# Patient Record
Sex: Female | Born: 1990 | Race: Black or African American | Hispanic: No | Marital: Single | Smoking: Never smoker
Health system: Southern US, Community
[De-identification: ages and names within clinical notes are randomized; demographics above are authoritative.]

## PROBLEM LIST (undated history)

## (undated) DIAGNOSIS — N189 Chronic kidney disease, unspecified: Secondary | ICD-10-CM

## (undated) DIAGNOSIS — M329 Systemic lupus erythematosus, unspecified: Secondary | ICD-10-CM

## (undated) DIAGNOSIS — I10 Essential (primary) hypertension: Secondary | ICD-10-CM

## (undated) DIAGNOSIS — N052 Unspecified nephritic syndrome with diffuse membranous glomerulonephritis: Secondary | ICD-10-CM

## (undated) DIAGNOSIS — J849 Interstitial pulmonary disease, unspecified: Secondary | ICD-10-CM

## (undated) DIAGNOSIS — J45909 Unspecified asthma, uncomplicated: Secondary | ICD-10-CM

## (undated) HISTORY — PX: CHOLECYSTECTOMY: SHX55

## (undated) HISTORY — DX: Chronic kidney disease, unspecified: N18.9

## (undated) HISTORY — DX: Systemic lupus erythematosus, unspecified: M32.9

## (undated) HISTORY — DX: Interstitial pulmonary disease, unspecified: J84.9

## (undated) HISTORY — PX: RENAL BIOPSY: SHX156

## (undated) HISTORY — DX: Unspecified nephritic syndrome with diffuse membranous glomerulonephritis: N05.2

## (undated) HISTORY — DX: Essential (primary) hypertension: I10

## (undated) HISTORY — PX: LUNG BIOPSY: SHX232

## (undated) NOTE — *Deleted (*Deleted)
Preventive Care 21-39 Years Old, Female Preventive care refers to visits with your health care provider and lifestyle choices that can promote health and wellness. This includes:  A yearly physical exam. This may also be called an annual well check.  Regular dental visits and eye exams.  Immunizations.  Screening for certain conditions.  Healthy lifestyle choices, such as eating a healthy diet, getting regular exercise, not using drugs or products that contain nicotine and tobacco, and limiting alcohol use. What can I expect for my preventive care visit? Physical exam Your health care provider will check your:  Height and weight. This may be used to calculate body mass index (BMI), which tells if you are at a healthy weight.  Heart rate and blood pressure.  Skin for abnormal spots. Counseling Your health care provider may ask you questions about your:  Alcohol, tobacco, and drug use.  Emotional well-being.  Home and relationship well-being.  Sexual activity.  Eating habits.  Work and work environment.  Method of birth control.  Menstrual cycle.  Pregnancy history. What immunizations do I need?  Influenza (flu) vaccine  This is recommended every year. Tetanus, diphtheria, and pertussis (Tdap) vaccine  You may need a Td booster every 10 years. Varicella (chickenpox) vaccine  You may need this if you have not been vaccinated. Human papillomavirus (HPV) vaccine  If recommended by your health care provider, you may need three doses over 6 months. Measles, mumps, and rubella (MMR) vaccine  You may need at least one dose of MMR. You may also need a second dose. Meningococcal conjugate (MenACWY) vaccine  One dose is recommended if you are age 19-21 years and a first-year college student living in a residence hall, or if you have one of several medical conditions. You may also need additional booster doses. Pneumococcal conjugate (PCV13) vaccine  You may need  this if you have certain conditions and were not previously vaccinated. Pneumococcal polysaccharide (PPSV23) vaccine  You may need one or two doses if you smoke cigarettes or if you have certain conditions. Hepatitis A vaccine  You may need this if you have certain conditions or if you travel or work in places where you may be exposed to hepatitis A. Hepatitis B vaccine  You may need this if you have certain conditions or if you travel or work in places where you may be exposed to hepatitis B. Haemophilus influenzae type b (Hib) vaccine  You may need this if you have certain conditions. You may receive vaccines as individual doses or as more than one vaccine together in one shot (combination vaccines). Talk with your health care provider about the risks and benefits of combination vaccines. What tests do I need?  Blood tests  Lipid and cholesterol levels. These may be checked every 5 years starting at age 20.  Hepatitis C test.  Hepatitis B test. Screening  Diabetes screening. This is done by checking your blood sugar (glucose) after you have not eaten for a while (fasting).  Sexually transmitted disease (STD) testing.  BRCA-related cancer screening. This may be done if you have a family history of breast, ovarian, tubal, or peritoneal cancers.  Pelvic exam and Pap test. This may be done every 3 years starting at age 21. Starting at age 30, this may be done every 5 years if you have a Pap test in combination with an HPV test. Talk with your health care provider about your test results, treatment options, and if necessary, the need for more tests.   Follow these instructions at home: Eating and drinking   Eat a diet that includes fresh fruits and vegetables, whole grains, lean protein, and low-fat dairy.  Take vitamin and mineral supplements as recommended by your health care provider.  Do not drink alcohol if: ? Your health care provider tells you not to drink. ? You are  pregnant, may be pregnant, or are planning to become pregnant.  If you drink alcohol: ? Limit how much you have to 0-1 drink a day. ? Be aware of how much alcohol is in your drink. In the [place] one drink equals one 12 oz bottle of beer (355 mL), one 5 oz glass of wine (148 mL), or one 1 oz glass of hard liquor (44 mL). Lifestyle  Take daily care of your teeth and gums.  Stay active. Exercise for at least 30 minutes on 5 or more days each week.  Do not use any products that contain nicotine or tobacco, such as cigarettes, e-cigarettes, and chewing tobacco. If you need help quitting, ask your health care provider.  If you are sexually active, practice safe sex. Use a condom or other form of birth control (contraception) in order to prevent pregnancy and STIs (sexually transmitted infections). If you plan to become pregnant, see your health care provider for a preconception visit. What's next?  Visit your health care provider once a year for a well check visit.  Ask your health care provider how often you should have your eyes and teeth checked.  Stay up to date on all vaccines. This information is not intended to replace advice given to you by your health care provider. Make sure you discuss any questions you have with your health care provider. Document Revised: 10/09/2017 Document Reviewed: 10/09/2017 Elsevier Patient Education  2020 [employer] Breast Self-Awareness Breast self-awareness is knowing how your breasts look and feel. Doing breast self-awareness is important. It allows you to catch a breast problem early while it is still small and can be treated. All women should do breast self-awareness, including women who have had breast implants. Tell your doctor if you notice a change in your breasts. What you need:  A mirror.  A well-lit room. How to do a breast self-exam A breast self-exam is one way to learn what is normal for your breasts and to check for changes. To do a  breast self-exam: Look for changes  1. Take off all the clothes above your waist. 2. Stand in front of a mirror in a room with good lighting. 3. Put your hands on your hips. 4. Push your hands down. 5. Look at your breasts and nipples in the mirror to see if one breast or nipple looks different from the other. Check to see if: ? The shape of one breast is different. ? The size of one breast is different. ? There are wrinkles, dips, and bumps in one breast and not the other. 6. Look at each breast for changes in the skin, such as: ? Redness. ? Scaly areas. 7. Look for changes in your nipples, such as: ? Liquid around the nipples. ? Bleeding. ? Dimpling. ? Redness. ? A change in where the nipples are. Feel for changes  1. Lie on your back on the floor. 2. Feel each breast. To do this, follow these steps: ? Pick a breast to feel. ? Put the arm closest to that breast above your head. ? Use your other arm to feel the nipple area of your breast. Feel   the area with the pads of your three middle fingers by making small circles with your fingers. For the first circle, press lightly. For the second circle, press harder. For the third circle, press even harder. ? Keep making circles with your fingers at the different pressures as you move down your breast. Stop when you feel your ribs. ? Move your fingers a little toward the center of your body. ? Start making circles with your fingers again, this time going up until you reach your collarbone. ? Keep making up-and-down circles until you reach your armpit. Remember to keep using the three pressures. ? Feel the other breast in the same way. 3. Sit or stand in the tub or shower. 4. With soapy water on your skin, feel each breast the same way you did in step 2 when you were lying on the floor. Write down what you find Writing down what you find can help you remember what to tell your doctor. Write down:  What is normal for each breast.  Any  changes you find in each breast, including: ? The kind of changes you find. ? Whether you have pain. ? Size and location of any lumps.  When you last had your menstrual period. General tips  Check your breasts every month.  If you are breastfeeding, the best time to check your breasts is after you feed your baby or after you use a breast pump.  If you get menstrual periods, the best time to check your breasts is 5-7 days after your menstrual period is over.  With time, you will become comfortable with the self-exam, and you will begin to know if there are changes in your breasts. Contact a doctor if you:  See a change in the shape or size of your breasts or nipples.  See a change in the skin of your breast or nipples, such as red or scaly skin.  Have fluid coming from your nipples that is not normal.  Find a lump or thick area that was not there before.  Have pain in your breasts.  Have any concerns about your breast health. Summary  Breast self-awareness includes looking for changes in your breasts, as well as feeling for changes within your breasts.  Breast self-awareness should be done in front of a mirror in a well-lit room.  You should check your breasts every month. If you get menstrual periods, the best time to check your breasts is 5-7 days after your menstrual period is over.  Let your doctor know of any changes you see in your breasts, including changes in size, changes on the skin, pain or tenderness, or fluid from your nipples that is not normal. This information is not intended to replace advice given to you by your health care provider. Make sure you discuss any questions you have with your health care provider. Document Revised: 09/16/2017 Document Reviewed: 09/16/2017 Elsevier Patient Education  2020 Elsevier Inc.  

---
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
## 2011-10-21 DIAGNOSIS — E785 Hyperlipidemia, unspecified: Secondary | ICD-10-CM | POA: Insufficient documentation

## 2012-05-08 DIAGNOSIS — M62830 Muscle spasm of back: Secondary | ICD-10-CM | POA: Insufficient documentation

## 2012-05-08 DIAGNOSIS — M7071 Other bursitis of hip, right hip: Secondary | ICD-10-CM | POA: Insufficient documentation

## 2015-07-14 DIAGNOSIS — H9201 Otalgia, right ear: Secondary | ICD-10-CM | POA: Insufficient documentation

## 2016-03-30 DIAGNOSIS — R0602 Shortness of breath: Secondary | ICD-10-CM | POA: Diagnosis not present

## 2016-03-30 DIAGNOSIS — R0609 Other forms of dyspnea: Secondary | ICD-10-CM | POA: Diagnosis not present

## 2016-03-30 DIAGNOSIS — R51 Headache: Secondary | ICD-10-CM | POA: Diagnosis not present

## 2016-03-30 DIAGNOSIS — L930 Discoid lupus erythematosus: Secondary | ICD-10-CM | POA: Diagnosis not present

## 2016-07-11 DIAGNOSIS — G43909 Migraine, unspecified, not intractable, without status migrainosus: Secondary | ICD-10-CM | POA: Diagnosis not present

## 2016-10-28 DIAGNOSIS — R001 Bradycardia, unspecified: Secondary | ICD-10-CM | POA: Diagnosis not present

## 2016-10-28 DIAGNOSIS — R079 Chest pain, unspecified: Secondary | ICD-10-CM | POA: Diagnosis not present

## 2016-12-03 DIAGNOSIS — R238 Other skin changes: Secondary | ICD-10-CM | POA: Diagnosis not present

## 2016-12-03 DIAGNOSIS — M3213 Lung involvement in systemic lupus erythematosus: Secondary | ICD-10-CM | POA: Diagnosis not present

## 2016-12-03 DIAGNOSIS — I279 Pulmonary heart disease, unspecified: Secondary | ICD-10-CM | POA: Diagnosis not present

## 2017-02-14 DIAGNOSIS — R109 Unspecified abdominal pain: Secondary | ICD-10-CM | POA: Diagnosis not present

## 2017-02-14 DIAGNOSIS — N3001 Acute cystitis with hematuria: Secondary | ICD-10-CM | POA: Diagnosis not present

## 2017-02-17 ENCOUNTER — Emergency Department: Payer: Medicaid Other

## 2017-02-17 ENCOUNTER — Other Ambulatory Visit: Payer: Self-pay

## 2017-02-17 ENCOUNTER — Emergency Department
Admission: EM | Admit: 2017-02-17 | Discharge: 2017-02-17 | Disposition: A | Discharge status: Home / Self Care | Payer: Medicaid Other | Attending: Emergency Medicine | Admitting: Emergency Medicine

## 2017-02-17 DIAGNOSIS — R079 Chest pain, unspecified: Secondary | ICD-10-CM | POA: Diagnosis present

## 2017-02-17 DIAGNOSIS — R0789 Other chest pain: Secondary | ICD-10-CM

## 2017-02-17 LAB — COMPREHENSIVE METABOLIC PANEL
ALBUMIN: 3.8 g/dL (ref 3.5–5.0)
ALK PHOS: 78 U/L (ref 38–126)
ALT: 9 U/L — ABNORMAL LOW (ref 14–54)
ANION GAP: 11 (ref 5–15)
AST: 15 U/L (ref 15–41)
BUN: 20 mg/dL (ref 6–20)
CO2: 19 mmol/L — AB (ref 22–32)
Calcium: 8.8 mg/dL — ABNORMAL LOW (ref 8.9–10.3)
Chloride: 106 mmol/L (ref 101–111)
Creatinine, Ser: 0.79 mg/dL (ref 0.44–1.00)
GFR calc Af Amer: >60 mL/min (ref >60)
GFR calc non Af Amer: >60 mL/min (ref >60)
GLUCOSE: 98 mg/dL (ref 65–99)
POTASSIUM: 3.2 mmol/L — AB (ref 3.5–5.1)
SODIUM: 136 mmol/L (ref 135–145)
Total Bilirubin: 0.8 mg/dL (ref 0.3–1.2)
Total Protein: 7.1 g/dL (ref 6.5–8.1)

## 2017-02-17 LAB — POCT PREGNANCY, URINE
Preg Test, Ur: NEGATIVE
Preg Test, Ur: NEGATIVE

## 2017-02-17 LAB — CBC
HCT: 45.6 % (ref 35.0–47.0)
Hemoglobin: 14.9 g/dL (ref 12.0–16.0)
MCH: 29.6 pg (ref 26.0–34.0)
MCHC: 32.6 g/dL (ref 32.0–36.0)
MCV: 90.9 fL (ref 80.0–100.0)
Platelets: 273 10*3/uL (ref 150–440)
RBC: 5.02 MIL/uL (ref 3.80–5.20)
RDW: 15.1 % — AB (ref 11.5–14.5)
WBC: 5 10*3/uL (ref 3.6–11.0)

## 2017-02-17 LAB — TROPONIN I: Troponin I: <0.03 ng/mL (ref <0.03)

## 2017-02-17 MED ORDER — GI COCKTAIL ~~LOC~~
30.0000 mL | Freq: Once | ORAL | Status: AC
  Administered 2017-02-17: 30 mL via ORAL

## 2017-02-17 MED ORDER — TRAMADOL HCL 50 MG PO TABS: 50.0000 mg | ORAL_TABLET | Freq: Four times a day (QID) | ORAL | 0 refills | Status: DC | PRN

## 2017-02-17 MED ORDER — GI COCKTAIL ~~LOC~~
ORAL | Status: AC
  Filled 2017-02-17: qty 30

## 2017-02-17 NOTE — ED Triage Notes (Signed)
Chest pain with inspiration began today. Denies sore throat or body aches. History of lupus.

## 2017-02-17 NOTE — ED Notes (Addendum)
Patient in Xray at this time.

## 2017-02-17 NOTE — ED Notes (Signed)
Centralized chest pain, sharp-constant. Pt also reports SOB while at rest as well; sx and began today. Pt alert and oriented X4, active, cooperative, pt in NAD. RR even and unlabored, color WNL.

## 2017-02-17 NOTE — ED Notes (Signed)
Pt back from x-ray.

## 2017-02-17 NOTE — ED Provider Notes (Signed)
[facility] Emergency Department Provider Note   ____________________________________________    I have reviewed the triage vital signs and the nursing notes.   HISTORY  Chief Complaint Chest Pain     HPI Faith Camacho is a 27 y.o. female who presents with complaints of intermittent sharp chest pain which is in the center of her chest which started early this morning.  She reports she had this recently in the fall and was seen at hospital in [place] and had CT scan which was negative for PE and essentially negative workup.  She does have a history of lupus and takes mycophenolate and prednisone for this.  Denies fevers or chills or cough.  No nausea or vomiting or diaphoresis.  Pain seems to be unrelated to exertion.  Has not taken anything for   Hx of lupus  There are no active problems to display for this patient.    Prior to Admission medications   Medication Sig Start Date End Date Taking? Authorizing Provider  traMADol (ULTRAM) 50 MG tablet Take 1 tablet (50 mg total) by mouth every 6 (six) hours as needed. 02/17/17 02/17/18  Jene Every, MD     Allergies Patient has no known allergies.  No family history on file.  Social History No smoking, no drugs, no etoh  Review of Systems  Constitutional: No fever/chills Eyes: No visual changes.  ENT: No sore throat. Cardiovascular: As above Respiratory: Mild pleurisy Gastrointestinal: No abdominal pain.  No nausea, no vomiting.   Genitourinary: Negative for dysuria. Musculoskeletal: Negative for back pain. Skin: Negative for rash. Neurological: Negative for headaches    ____________________________________________   PHYSICAL EXAM:  VITAL SIGNS: ED Triage Vitals  Enc Vitals Group     BP 02/17/17 1647 (!) 129/97     Pulse Rate 02/17/17 1647 81     Resp 02/17/17 1647 20     Temp 02/17/17 1647 99 F (37.2 C)     Temp Source 02/17/17 1647 Oral     SpO2 02/17/17 1647 100 %   Weight 02/17/17 1648 81.6 kg (180 lb)     Height 02/17/17 1648 1.499 m (4\' 11" )     Head Circumference --      Peak Flow --      Pain Score 02/17/17 1647 8     Pain Loc --      Pain Edu? --      Excl. in GC? --     Constitutional: Alert and oriented. No acute distress. Pleasant and interactive Eyes: Conjunctivae are normal.   Nose: No congestion/rhinnorhea. Mouth/Throat: Mucous membranes are moist.   Neck:  Painless ROM Cardiovascular: Normal rate, regular rhythm. Grossly normal heart sounds.  Good peripheral circulation.  Chest wall tenderness to palpation Respiratory: Normal respiratory effort.  No retractions. Lungs CTAB. Gastrointestinal: Soft and nontender. No distention.   Genitourinary: deferred Musculoskeletal: No lower extremity tenderness nor edema.  Warm and well perfused Neurologic:  Normal speech and language. No gross focal neurologic deficits are appreciated.  Skin:  Skin is warm, dry and intact. No rash noted. Psychiatric: Mood and affect are normal. Speech and behavior are normal.  ____________________________________________   LABS (all labs ordered are listed, but only abnormal results are displayed)  Labs Reviewed  CBC - Abnormal; Notable for the following components:      Result Value   RDW 15.1 (*)    All other components within normal limits  COMPREHENSIVE METABOLIC PANEL - Abnormal; Notable for the following components:  Potassium 3.2 (*)    CO2 19 (*)    Calcium 8.8 (*)    ALT 9 (*)    All other components within normal limits  TROPONIN I  POC URINE PREG, ED  POCT PREGNANCY, URINE  POCT PREGNANCY, URINE   ____________________________________________  EKG  ED ECG REPORT I, Jene EveryKINNER, Juri Dinning, the attending physician, personally viewed and interpreted this ECG.  Date: 02/17/2017  Rhythm: normal sinus rhythm QRS Axis: normal Intervals: normal ST/T Wave abnormalities: normal Narrative Interpretation: no evidence of acute  ischemia  ____________________________________________  RADIOLOGY  Chest x-ray normal ____________________________________________   PROCEDURES  Procedure(s) performed: No  Procedures   Critical Care performed: No ____________________________________________   INITIAL IMPRESSION / ASSESSMENT AND PLAN / ED COURSE  Pertinent labs & imaging results that were available during my care of the patient were reviewed by me and considered in my medical decision making (see chart for details).  Patient well-appearing in no acute distress.  Vital signs are unremarkable.  Exam is reassuring.  Given that she recently had the same pain and underwent normal CT angiography I am reassured that this is unlikely to be a PE.  EKG is completely normal.  Will check labs, and reevaluate.  Workup reassuring, patient did have  improvement with GI cocktail, possibly component of gastritis to this versus esophagitis.  Regardless I will have her follow-up with cardiology for thorough evaluation given her history of lupus.  Return precautions discussed with the patient, she agrees with plan    ____________________________________________   FINAL CLINICAL IMPRESSION(S) / ED DIAGNOSES  Final diagnoses:  Atypical chest pain        Note:  This document was prepared using Dragon voice recognition software and may include unintentional dictation errors.    Jene EveryKinner, Devonte Migues, MD 02/17/17 [phone]

## 2017-02-21 DIAGNOSIS — E782 Mixed hyperlipidemia: Secondary | ICD-10-CM | POA: Diagnosis not present

## 2017-02-21 DIAGNOSIS — I208 Other forms of angina pectoris: Secondary | ICD-10-CM

## 2017-02-21 DIAGNOSIS — I10 Essential (primary) hypertension: Secondary | ICD-10-CM | POA: Diagnosis not present

## 2017-03-10 ENCOUNTER — Emergency Department
Admission: EM | Admit: 2017-03-10 | Discharge: 2017-03-10 | Disposition: A | Discharge status: Home / Self Care | Payer: Medicaid Other | Attending: Emergency Medicine | Admitting: Emergency Medicine

## 2017-03-10 ENCOUNTER — Encounter: Payer: Self-pay | Admitting: Emergency Medicine

## 2017-03-10 ENCOUNTER — Other Ambulatory Visit: Payer: Self-pay

## 2017-03-10 DIAGNOSIS — R05 Cough: Secondary | ICD-10-CM | POA: Diagnosis not present

## 2017-03-10 DIAGNOSIS — J45909 Unspecified asthma, uncomplicated: Secondary | ICD-10-CM | POA: Insufficient documentation

## 2017-03-10 DIAGNOSIS — J069 Acute upper respiratory infection, unspecified: Secondary | ICD-10-CM | POA: Diagnosis not present

## 2017-03-10 DIAGNOSIS — M3210 Systemic lupus erythematosus, organ or system involvement unspecified: Secondary | ICD-10-CM | POA: Insufficient documentation

## 2017-03-10 DIAGNOSIS — R0981 Nasal congestion: Secondary | ICD-10-CM | POA: Insufficient documentation

## 2017-03-10 DIAGNOSIS — H9203 Otalgia, bilateral: Secondary | ICD-10-CM | POA: Diagnosis not present

## 2017-03-10 DIAGNOSIS — J029 Acute pharyngitis, unspecified: Secondary | ICD-10-CM | POA: Diagnosis present

## 2017-03-10 HISTORY — DX: Unspecified asthma, uncomplicated: J45.909

## 2017-03-10 MED ORDER — ACETAMINOPHEN 325 MG PO TABS
650.0000 mg | ORAL_TABLET | Freq: Once | ORAL | Status: AC
  Administered 2017-03-10: 650 mg via ORAL
  Filled 2017-03-10: qty 2

## 2017-03-10 NOTE — ED Triage Notes (Signed)
Pt presents to ED with bilateral ear pain and throat pain since yesterday. Denies fever.

## 2017-03-10 NOTE — ED Provider Notes (Signed)
[facility] Emergency Department Provider Note   ____________________________________________    I have reviewed the triage vital signs and the nursing notes.   HISTORY  Chief Complaint Sore Throat and Otalgia     HPI Faith Camacho is a 27 y.o. female with a history of lupus who is on mycophenolate and prednisone who presents with complaints of mild sore throat, nasal congestion and a dry cough which started yesterday.  Patient is not take anything for this.  She denies shortness of breath.  No fevers or chills..  No sinus tenderness or pressure.  She also reports her ears are bothering her but she thinks this is related to her throat.  No difficulty breathing or swallowing   Past Medical History:  Diagnosis Date  . Asthma   . Lupus     There are no active problems to display for this patient.   Past Surgical History:  Procedure Laterality Date  . CHOLECYSTECTOMY    . LUNG BIOPSY    . RENAL BIOPSY      Prior to Admission medications   Medication Sig Start Date End Date Taking? Authorizing Provider  traMADol (ULTRAM) 50 MG tablet Take 1 tablet (50 mg total) by mouth every 6 (six) hours as needed. 02/17/17 02/17/18  Jene EveryKinner, Xara Paulding, MD     Allergies Patient has no known allergies.  No family history on file.  Social History Social History   Tobacco Use  . Smoking status: Never Smoker  . Smokeless tobacco: Never Used  Substance Use Topics  . Alcohol use: No    Frequency: Never  . Drug use: No    Review of Systems  Constitutional: No fever/chills  ENT: Positive sore throat   Gastrointestinal: No abdominal pain.  No nausea, no vomiting.    Musculoskeletal: Negative for  joint swelling Skin: Negative for rash. Neurological: Negative for headaches     ____________________________________________   PHYSICAL EXAM:  VITAL SIGNS: ED Triage Vitals  Enc Vitals Group     BP 03/10/17 2011 130/80     Pulse Rate 03/10/17 2011  71     Resp 03/10/17 2011 16     Temp 03/10/17 2011 98.5 F (36.9 C)     Temp Source 03/10/17 2011 Oral     SpO2 03/10/17 2011 100 %     Weight 03/10/17 2011 81.6 kg (180 lb)     Height 03/10/17 2011 1.499 m (4\' 11" )     Head Circumference --      Peak Flow --      Pain Score 03/10/17 2010 8     Pain Loc --      Pain Edu? --      Excl. in GC? --     Constitutional: Alert and oriented. No acute distress.  Eyes: Conjunctivae are normal.  Head: Atraumatic. Nose: No congestion/rhinnorhea. Mouth/Throat: Mucous membranes are moist.  Pharynx normal, no swelling, no stridor Cardiovascular: Normal rate, regular rhythm.  Respiratory: Normal respiratory effort.  No retractions.  Clear to auscultation bilaterally Genitourinary: deferred Musculoskeletal: No joint swelling.   Neurologic:  Normal speech and language. No gross focal neurologic deficits are appreciated.   Skin:  Skin is warm, dry and intact. No rash noted.   ____________________________________________   LABS (all labs ordered are listed, but only abnormal results are displayed)  Labs Reviewed - No data to display ____________________________________________  EKG   ____________________________________________  RADIOLOGY  None ____________________________________________   PROCEDURES  Procedure(s) performed: No  Procedures  Critical Care performed: No ____________________________________________   INITIAL IMPRESSION / ASSESSMENT AND PLAN / ED COURSE  Pertinent labs & imaging results that were available during my care of the patient were reviewed by me and considered in my medical decision making (see chart for details).  Patient well-appearing in no acute distress.  Symptoms are minor and most consistent with viral upper respiratory tract infection.  Consider the fact that she is somewhat immunosuppressed however no evidence of a bacterial infection and vital signs quite reassuring, afebrile with  unremarkable exam recommend discharge with close PCP follow-up.  Strict return precautions discussed   ____________________________________________   FINAL CLINICAL IMPRESSION(S) / ED DIAGNOSES  Final diagnoses:  Viral upper respiratory tract infection      NEW MEDICATIONS STARTED DURING THIS VISIT:  Discharge Medication List as of 03/10/2017  9:20 PM       Note:  This document was prepared using Dragon voice recognition software and may include unintentional dictation errors.    Jene Every, MD 03/10/17 2237

## 2017-04-13 ENCOUNTER — Encounter: Payer: Self-pay | Admitting: Emergency Medicine

## 2017-04-13 ENCOUNTER — Emergency Department
Admission: EM | Admit: 2017-04-13 | Discharge: 2017-04-13 | Disposition: A | Discharge status: Home / Self Care | Payer: Medicaid Other | Attending: Emergency Medicine | Admitting: Emergency Medicine

## 2017-04-13 DIAGNOSIS — R601 Generalized edema: Secondary | ICD-10-CM | POA: Diagnosis present

## 2017-04-13 DIAGNOSIS — M3210 Systemic lupus erythematosus, organ or system involvement unspecified: Secondary | ICD-10-CM | POA: Diagnosis not present

## 2017-04-13 DIAGNOSIS — M329 Systemic lupus erythematosus, unspecified: Secondary | ICD-10-CM

## 2017-04-13 DIAGNOSIS — J45909 Unspecified asthma, uncomplicated: Secondary | ICD-10-CM | POA: Insufficient documentation

## 2017-04-13 LAB — CBC WITH DIFFERENTIAL/PLATELET
Basophils Absolute: 0 10*3/uL (ref 0–0.1)
Basophils Relative: 1 %
EOS ABS: 0 10*3/uL (ref 0–0.7)
Eosinophils Relative: 0 %
HEMATOCRIT: 42.6 % (ref 35.0–47.0)
HEMOGLOBIN: 13.8 g/dL (ref 12.0–16.0)
LYMPHS ABS: 1 10*3/uL (ref 1.0–3.6)
LYMPHS PCT: 20 %
MCH: 29.8 pg (ref 26.0–34.0)
MCHC: 32.4 g/dL (ref 32.0–36.0)
MCV: 92 fL (ref 80.0–100.0)
MONOS PCT: 9 %
Monocytes Absolute: 0.4 10*3/uL (ref 0.2–0.9)
NEUTROS ABS: 3.6 10*3/uL (ref 1.4–6.5)
NEUTROS PCT: 70 %
Platelets: 242 10*3/uL (ref 150–440)
RBC: 4.63 MIL/uL (ref 3.80–5.20)
RDW: 15.7 % — ABNORMAL HIGH (ref 11.5–14.5)
WBC: 5.1 10*3/uL (ref 3.6–11.0)

## 2017-04-13 LAB — COMPREHENSIVE METABOLIC PANEL
ALT: 15 U/L (ref 14–54)
AST: 18 U/L (ref 15–41)
Albumin: 2.9 g/dL — ABNORMAL LOW (ref 3.5–5.0)
Alkaline Phosphatase: 56 U/L (ref 38–126)
Anion gap: 11 (ref 5–15)
BUN: 17 mg/dL (ref 6–20)
CHLORIDE: 107 mmol/L (ref 101–111)
CO2: 20 mmol/L — AB (ref 22–32)
CREATININE: 0.71 mg/dL (ref 0.44–1.00)
Calcium: 8.2 mg/dL — ABNORMAL LOW (ref 8.9–10.3)
GFR calc non Af Amer: >60 mL/min (ref >60)
Glucose, Bld: 93 mg/dL (ref 65–99)
POTASSIUM: 2.6 mmol/L — AB (ref 3.5–5.1)
SODIUM: 138 mmol/L (ref 135–145)
Total Bilirubin: 0.8 mg/dL (ref 0.3–1.2)
Total Protein: 6 g/dL — ABNORMAL LOW (ref 6.5–8.1)

## 2017-04-13 LAB — POCT PREGNANCY, URINE: Preg Test, Ur: NEGATIVE

## 2017-04-13 MED ORDER — PREDNISONE 20 MG PO TABS
20.0000 mg | ORAL_TABLET | Freq: Once | ORAL | Status: AC
  Administered 2017-04-13: 20 mg via ORAL
  Filled 2017-04-13: qty 1

## 2017-04-13 MED ORDER — TRAMADOL HCL 50 MG PO TABS: 50.0000 mg | ORAL_TABLET | Freq: Four times a day (QID) | ORAL | 0 refills | Status: DC | PRN

## 2017-04-13 MED ORDER — TRAMADOL HCL 50 MG PO TABS
50.0000 mg | ORAL_TABLET | Freq: Once | ORAL | Status: AC
  Administered 2017-04-13: 50 mg via ORAL
  Filled 2017-04-13: qty 1

## 2017-04-13 MED ORDER — POTASSIUM CHLORIDE CRYS ER 20 MEQ PO TBCR
40.0000 meq | EXTENDED_RELEASE_TABLET | Freq: Once | ORAL | Status: AC
  Administered 2017-04-13: 40 meq via ORAL
  Filled 2017-04-13: qty 2

## 2017-04-13 MED ORDER — NAPROXEN 500 MG PO TABS
500.0000 mg | ORAL_TABLET | Freq: Once | ORAL | Status: AC
  Administered 2017-04-13: 500 mg via ORAL
  Filled 2017-04-13: qty 1

## 2017-04-13 NOTE — ED Notes (Signed)
Rainbow sent down to lab with save tubes

## 2017-04-13 NOTE — ED Notes (Signed)
Patient requesting something more for pain, MD notified orders received.

## 2017-04-13 NOTE — ED Notes (Signed)
Patient reports symptoms began earlier today, noticed hands and feet seemed to be swelling and that she had some all over body aches.  Patient denies anyone else in the home being sick.  Reports last lupus flare was approximately 2 months ago.  Patient ambulatory without difficulty or distress noted.  No obvious swelling noted to hands or feet.

## 2017-04-13 NOTE — ED Triage Notes (Signed)
Pt comes into the ED via POV c/o hand and feet swelling as well as generalized body aches.  Patient has lupus and believes this to be a lupus flare up.  Patient states this feels similar to when that has occurred int he past.  Denies any chest pain, shortness of breath, or dizziness.  Patient in NAD at this time with even and unlabored respirations.

## 2017-04-13 NOTE — ED Provider Notes (Signed)
[facility] Emergency Department Provider Note   ____________________________________________    I have reviewed the triage vital signs and the nursing notes.   HISTORY  Chief Complaint Edema     HPI Faith Camacho is a 27 y.o. female who presents with complaints of joint aching and mild swelling.  Patient reports she has a history of lupus and this feels like it may be a lupus flare to her.  She complains of mild swelling in her hands and feet.  Some's mild soreness in the wrists and ankles as well.  No cough shortness of breath or chest pain.  No fevers or chills.  Takes prednisone 5 mg daily.  Follows with [facility] rheumatology   Past Medical History:  Diagnosis Date  . Asthma   . Lupus     There are no active problems to display for this patient.   Past Surgical History:  Procedure Laterality Date  . CHOLECYSTECTOMY    . LUNG BIOPSY    . RENAL BIOPSY      Prior to Admission medications   Medication Sig Start Date End Date Taking? Authorizing Provider  traMADol (ULTRAM) 50 MG tablet Take 1 tablet (50 mg total) by mouth every 6 (six) hours as needed. 02/17/17 02/17/18  Jene Every, MD     Allergies Patient has no known allergies.  No family history on file.  Social History Social History   Tobacco Use  . Smoking status: Never Smoker  . Smokeless tobacco: Never Used  Substance Use Topics  . Alcohol use: No    Frequency: Never  . Drug use: No    Review of Systems  Constitutional: No fever/chills Eyes: No visual changes.  ENT: No sore throat. Cardiovascular: Denies chest pain. Respiratory: Denies shortness of breath. Gastrointestinal: No abdominal pain.    Genitourinary: Negative for dysuria. Musculoskeletal: Joint pain as above Skin: Negative for rash. Neurological: Negative for headaches    ____________________________________________   PHYSICAL EXAM:  VITAL SIGNS: ED Triage Vitals [04/13/17 2000]  Enc Vitals  Group     BP 117/75     Pulse Rate 64     Resp 18     Temp 98.1 F (36.7 C)     Temp Source Oral     SpO2 99 %     Weight 75.8 kg (167 lb)     Height 1.499 m (4\' 11" )     Head Circumference      Peak Flow      Pain Score 9     Pain Loc      Pain Edu?      Excl. in GC?     Constitutional: Alert and oriented. No acute distress. Pleasant and interactive Eyes: Conjunctivae are normal.   Nose: No congestion/rhinnorhea. Mouth/Throat: Mucous membranes are moist.    Cardiovascular: Normal rate, regular rhythm. Grossly normal heart sounds.  Good peripheral circulation. Respiratory: Normal respiratory effort.  No retractions. Lungs CTAB. Gastrointestinal: Soft and nontender. No distention.  No CVA tenderness. Genitourinary: deferred Musculoskeletal: Minimal edema bilateral feet, no objective edema in the hands or significant swelling.  Mild discomfort with range of motion of the wrists and ankles.  No erythema Neurologic:  Normal speech and language. No gross focal neurologic deficits are appreciated.  Skin:  Skin is warm, dry and intact.  Psychiatric: Mood and affect are normal. Speech and behavior are normal.  ____________________________________________   LABS (all labs ordered are listed, but only abnormal results are displayed)  Labs  Reviewed  COMPREHENSIVE METABOLIC PANEL - Abnormal; Notable for the following components:      Result Value   Potassium 2.6 (*)    CO2 20 (*)    Calcium 8.2 (*)    Total Protein 6.0 (*)    Albumin 2.9 (*)    All other components within normal limits  CBC WITH DIFFERENTIAL/PLATELET - Abnormal; Notable for the following components:   RDW 15.7 (*)    All other components within normal limits  POCT PREGNANCY, URINE  POC URINE PREG, ED   ____________________________________________  EKG None ____________________________________________  RADIOLOGY   ____________________________________________   PROCEDURES  Procedure(s) performed:  No  Procedures   Critical Care performed: No ____________________________________________   INITIAL IMPRESSION / ASSESSMENT AND PLAN / ED COURSE  Pertinent labs & imaging results that were available during my care of the patient were reviewed by me and considered in my medical decision making (see chart for details).  Patient with a history of systemic lupus, on prednisone and CellCept apparently who presents with possible lupus flare.  Overall reassuring exam, vitals normal.  Lab work significant only for mild hypokalemia and she appears chronically hypokalemic.  We will replete with p.o. potassium, creatinine is normal.  Blood counts are normal.  Patient treated with tramadol and 20 mg of prednisone which has improved her pain.  She feels comfortable going home.  Will follow up with rheumatology, return precautions discussed.    ____________________________________________   FINAL CLINICAL IMPRESSION(S) / ED DIAGNOSES  Final diagnoses:  Systemic lupus erythematosus, unspecified SLE type, unspecified organ involvement status (HCC)        Note:  This document was prepared using Dragon voice recognition software and may include unintentional dictation errors.    Jene EveryKinner, Krystine Pabst, MD 04/13/17 2328

## 2017-04-21 DIAGNOSIS — M3214 Glomerular disease in systemic lupus erythematosus: Secondary | ICD-10-CM | POA: Diagnosis not present

## 2017-04-21 DIAGNOSIS — N052 Unspecified nephritic syndrome with diffuse membranous glomerulonephritis: Secondary | ICD-10-CM | POA: Diagnosis not present

## 2017-04-21 DIAGNOSIS — N182 Chronic kidney disease, stage 2 (mild): Secondary | ICD-10-CM | POA: Diagnosis not present

## 2017-04-21 DIAGNOSIS — I10 Essential (primary) hypertension: Secondary | ICD-10-CM | POA: Diagnosis not present

## 2017-04-23 ENCOUNTER — Encounter: Payer: Self-pay | Admitting: Obstetrics and Gynecology

## 2017-04-29 ENCOUNTER — Ambulatory Visit (INDEPENDENT_AMBULATORY_CARE_PROVIDER_SITE_OTHER): Payer: Medicaid Other | Admitting: Obstetrics and Gynecology

## 2017-04-29 ENCOUNTER — Encounter: Payer: Self-pay | Admitting: Obstetrics and Gynecology

## 2017-04-29 VITALS — BP 123/82 | HR 79 | Ht 59.0 in | Wt 166.1 lb

## 2017-04-29 DIAGNOSIS — Z7689 Persons encountering health services in other specified circumstances: Secondary | ICD-10-CM

## 2017-04-29 DIAGNOSIS — N946 Dysmenorrhea, unspecified: Secondary | ICD-10-CM | POA: Diagnosis not present

## 2017-04-29 DIAGNOSIS — Z113 Encounter for screening for infections with a predominantly sexual mode of transmission: Secondary | ICD-10-CM | POA: Diagnosis not present

## 2017-04-29 MED ORDER — IBUPROFEN 600 MG PO TABS: 600.0000 mg | ORAL_TABLET | Freq: Four times a day (QID) | ORAL | 3 refills | Status: DC | PRN

## 2017-04-29 NOTE — Progress Notes (Signed)
GYNECOLOGY CLINIC PROGRESS NOTE Subjective:     Faith Camacho is a 27 y.o. G0P0000 female here for to establish care.  She has relocated from [place], [place].  Last physical exam was in May 2018. Current complaints: dysmenorrhea (usally noted around 2nd or 3rd day). She receives only modest relief from her OCPs and Midol. Has been on current OCP on and off for 2 years.  Patient is currently sexually active, 1 partner, female (x 2 months).    Gynecologic History Patient's last menstrual period was 04/12/2017. Contraception: OCP (estrogen/progesterone) Last Pap: 2018. Results were: normal.  Does have h/o 1 prior abnormal pap smear.  Menses: lasts 5 days.  Heaviest days are first 1-2 days. Notes passage of clots.  Denies h/o STIs.   Obstetric History OB History  Gravida Para Term Preterm AB Living  0 0 0 0 0 0  SAB TAB Ectopic Multiple Live Births  0 0 0 0 0        Past Medical History:  Diagnosis Date  . Asthma   . Lupus     Family History  Problem Relation Age of Onset  . Hypertension Maternal Grandmother     Past Surgical History:  Procedure Laterality Date  . CHOLECYSTECTOMY    . LUNG BIOPSY    . RENAL BIOPSY      Social History   Socioeconomic History  . Marital status: Single    Spouse name: Not on file  . Number of children: Not on file  . Years of education: Not on file  . Highest education level: Not on file  Social Needs  . Financial resource strain: Not on file  . Food insecurity - worry: Not on file  . Food insecurity - inability: Not on file  . Transportation needs - medical: Not on file  . Transportation needs - non-medical: Not on file  Occupational History  . Not on file  Tobacco Use  . Smoking status: Never Smoker  . Smokeless tobacco: Never Used  Substance and Sexual Activity  . Alcohol use: No    Frequency: Never  . Drug use: No  . Sexual activity: Yes    Birth control/protection: Pill  Other Topics Concern  . Not on file  Social  History Narrative  . Not on file    Current Outpatient Medications on File Prior to Visit  Medication Sig Dispense Refill  . beclomethasone (QVAR) 80 MCG/ACT inhaler Inhale 2 puffs into the lungs 2 (two) times daily.    . calcium citrate-vitamin D (CITRACAL+D) 315-200 MG-UNIT tablet Take 1 tablet by mouth 2 (two) times daily.    Marland Kitchen gabapentin (NEURONTIN) 300 MG capsule Take 300 mg by mouth 3 (three) times daily.    Marland Kitchen losartan (COZAAR) 50 MG tablet Take 50 mg by mouth daily.    . mycophenolate (CELLCEPT) 500 MG tablet Take by mouth 2 (two) times daily.    Marland Kitchen omeprazole (PRILOSEC) 20 MG capsule Take 20 mg by mouth daily.    . potassium chloride (K-DUR) 10 MEQ tablet Take 10 mEq by mouth daily.    . predniSONE (DELTASONE) 5 MG tablet Take 5 mg by mouth daily with breakfast.    . sodium bicarbonate 325 MG tablet Take 325 mg by mouth.    . topiramate (TOPAMAX) 25 MG tablet Take 25 mg by mouth 2 (two) times daily.    . traMADol (ULTRAM) 50 MG tablet Take 1 tablet (50 mg total) by mouth every 6 (six) hours as needed. 20  tablet 0   No current facility-administered medications on file prior to visit.     No Known Allergies   Review of Systems  Constitutional: negative for chills, fatigue, fevers and sweats Eyes: negative for irritation, redness and visual disturbance Ears, nose, mouth, throat, and face: negative for hearing loss, nasal congestion, snoring and tinnitus Respiratory: negative for asthma, cough, sputum Cardiovascular: negative for chest pain, dyspnea, exertional chest pressure/discomfort, irregular heart beat, palpitations and syncope Gastrointestinal: negative for abdominal pain, change in bowel habits, nausea and vomiting Genitourinary: negative for abnormal menstrual periods, genital lesions, sexual problems and vaginal discharge, dysuria and urinary incontinence. Positive for dysmenorrhea Integument/breast: negative for breast lump, breast tenderness and nipple  discharge Hematologic/lymphatic: negative for bleeding and easy bruising Musculoskeletal:negative for back pain and muscle weakness Neurological: negative for dizziness, headaches, vertigo and weakness Endocrine: negative for diabetic symptoms including polydipsia, polyuria and skin dryness Allergic/Immunologic: negative for hay fever and urticaria     Objective:    BP 123/82   Pulse 79   Ht 4\' 11"  (1.499 m)   Wt 166 lb 1.6 oz (75.3 kg)   LMP 04/12/2017   BMI 33.55 kg/m  General appearance: alert and no distress Lungs: clear to auscultation bilaterally Heart: regular rate and rhythm, S1, S2 normal, no murmur, click, rub or gallop Abdomen: soft, non-tender; bowel sounds normal; no masses,  no organomegaly Pelvic: deferred Extremities: extremities normal, atraumatic, no cyanosis or edema Neurologic: Grossly normal    Assessment:   Establish care Dysmenorrhea   Plan:   - Contraception: OCP (estrogen/progesterone).  Patient cannot recall which OCP she is currently taking. Discussed changing her OCP to a higher estrogen dose to better manage her dysmenorrhea if she is not already on a high dose.  Will contact patient's pharmacy to see which OCP she is currently using.  She is encouraged to finish out current pill pack and then start new OCPs after next cycle.  - Dysmenorrhea, not managed by current OCPs or Midol. Will prescribe 600 mg Ibuprofen to be taken scheduled q 6 hrs for 3-4 days during menses.  - Will f/u in 2 months for next annual exam, and to f/u with dysmenorrhea   Hildred Laserherry, Makinzee Durley, MD [facility]

## 2017-04-29 NOTE — Patient Instructions (Addendum)
Medical Screening Exam A medical screening exam helps determine whether or not you need emergency medical treatment. During the medical screening exam, a health care provider does a short physical exam and medical history to assess:  Your current symptoms.  Your overall health.  Depending on your symptoms, you may need additional tests. What are the possible outcomes of a medical screening exam? Your medical screening exam may determine that:  You do not need emergency treatment at this time.  You need treatment right away.  You need to be transferred to another medical center.  When should I seek medical care? If you have a regular health care provider, make an appointment for a follow-up visit with him or her. If you do not have a regular health care provider, ask about resources in your community. Get help right away if: Your condition may change over time. If your condition gets worse or you develop new or troubling symptoms before you see your health care provider, go to an emergency department right away. In an emergency:  Call 911 or have someone drive you to the nearest hospital.  Do not drive yourself. This information is not intended to replace advice given to you by your health care provider. Make sure you discuss any questions you have with your health care provider. Document Released: 03/07/2004 Document Revised: 10/10/2015 Document Reviewed: 11/10/2014 Elsevier Interactive Patient Education  [employer]2018 [employer]

## 2017-04-29 NOTE — Progress Notes (Signed)
Pt have very bad cramps no other concerns.

## 2017-04-30 LAB — HIV ANTIBODY (ROUTINE TESTING W REFLEX): HIV SCREEN 4TH GENERATION: NONREACTIVE

## 2017-04-30 MED ORDER — NORETHIN ACE-ETH ESTRAD-FE 1.5-30 MG-MCG PO TABS: 1.0000 | ORAL_TABLET | Freq: Every day | ORAL | 11 refills | Status: DC

## 2017-05-01 DIAGNOSIS — L03315 Cellulitis of perineum: Secondary | ICD-10-CM | POA: Diagnosis not present

## 2017-05-01 LAB — GC/CHLAMYDIA PROBE AMP
CHLAMYDIA, DNA PROBE: NEGATIVE
NEISSERIA GONORRHOEAE BY PCR: NEGATIVE

## 2017-05-02 ENCOUNTER — Encounter: Payer: Self-pay | Admitting: Obstetrics and Gynecology

## 2017-05-28 ENCOUNTER — Encounter: Payer: Self-pay | Admitting: Neurology

## 2017-06-09 ENCOUNTER — Encounter: Payer: Self-pay | Admitting: *Deleted

## 2017-06-09 ENCOUNTER — Emergency Department
Admission: EM | Admit: 2017-06-09 | Discharge: 2017-06-09 | Disposition: A | Discharge status: Home / Self Care | Payer: Medicaid Other | Attending: Emergency Medicine | Admitting: Emergency Medicine

## 2017-06-09 ENCOUNTER — Other Ambulatory Visit: Payer: Self-pay

## 2017-06-09 ENCOUNTER — Encounter: Payer: Self-pay | Admitting: Neurology

## 2017-06-09 ENCOUNTER — Telehealth: Payer: Self-pay | Admitting: Emergency Medicine

## 2017-06-09 DIAGNOSIS — M79602 Pain in left arm: Secondary | ICD-10-CM | POA: Diagnosis not present

## 2017-06-09 DIAGNOSIS — M79605 Pain in left leg: Secondary | ICD-10-CM | POA: Diagnosis not present

## 2017-06-09 DIAGNOSIS — M79601 Pain in right arm: Secondary | ICD-10-CM | POA: Diagnosis not present

## 2017-06-09 DIAGNOSIS — Z5321 Procedure and treatment not carried out due to patient leaving prior to being seen by health care provider: Secondary | ICD-10-CM | POA: Insufficient documentation

## 2017-06-09 DIAGNOSIS — M79604 Pain in right leg: Secondary | ICD-10-CM | POA: Insufficient documentation

## 2017-06-09 LAB — CBC
HCT: 45.4 % (ref 35.0–47.0)
Hemoglobin: 15.1 g/dL (ref 12.0–16.0)
MCH: 30.3 pg (ref 26.0–34.0)
MCHC: 33.2 g/dL (ref 32.0–36.0)
MCV: 91.4 fL (ref 80.0–100.0)
Platelets: 234 10*3/uL (ref 150–440)
RBC: 4.97 MIL/uL (ref 3.80–5.20)
RDW: 14.4 % (ref 11.5–14.5)
WBC: 5 10*3/uL (ref 3.6–11.0)

## 2017-06-09 LAB — BASIC METABOLIC PANEL
Anion gap: 10 (ref 5–15)
BUN: 14 mg/dL (ref 6–20)
CHLORIDE: 104 mmol/L (ref 101–111)
CO2: 21 mmol/L — ABNORMAL LOW (ref 22–32)
CREATININE: 0.8 mg/dL (ref 0.44–1.00)
Calcium: 8.3 mg/dL — ABNORMAL LOW (ref 8.9–10.3)
GFR calc Af Amer: >60 mL/min (ref >60)
GFR calc non Af Amer: >60 mL/min (ref >60)
GLUCOSE: 90 mg/dL (ref 65–99)
POTASSIUM: 3.1 mmol/L — AB (ref 3.5–5.1)
Sodium: 135 mmol/L (ref 135–145)

## 2017-06-09 NOTE — Telephone Encounter (Signed)
Called patient due to lwot to inquire about condition and follow up plans. Says she is waiting for her doctor to call back.

## 2017-06-09 NOTE — ED Triage Notes (Signed)
Pt has bilateral arm and leg pain.  Pt off gout meds for 3 months.  No injury to arms or legs.  pain since yesterday. Pt alert

## 2017-06-10 ENCOUNTER — Emergency Department
Admission: EM | Admit: 2017-06-10 | Discharge: 2017-06-10 | Disposition: A | Discharge status: Home / Self Care | Payer: Medicaid Other | Attending: Emergency Medicine | Admitting: Emergency Medicine

## 2017-06-10 ENCOUNTER — Encounter: Payer: Self-pay | Admitting: Emergency Medicine

## 2017-06-10 ENCOUNTER — Other Ambulatory Visit: Payer: Self-pay

## 2017-06-10 DIAGNOSIS — M7918 Myalgia, other site: Secondary | ICD-10-CM | POA: Diagnosis not present

## 2017-06-10 DIAGNOSIS — E876 Hypokalemia: Secondary | ICD-10-CM | POA: Insufficient documentation

## 2017-06-10 DIAGNOSIS — Z79899 Other long term (current) drug therapy: Secondary | ICD-10-CM | POA: Insufficient documentation

## 2017-06-10 DIAGNOSIS — J45909 Unspecified asthma, uncomplicated: Secondary | ICD-10-CM | POA: Diagnosis not present

## 2017-06-10 DIAGNOSIS — M79641 Pain in right hand: Secondary | ICD-10-CM | POA: Diagnosis present

## 2017-06-10 MED ORDER — PREDNISONE 10 MG PO TABS: ORAL_TABLET | ORAL | 0 refills | Status: DC

## 2017-06-10 NOTE — ED Triage Notes (Signed)
Pt to ED via POV c/o hand pain. Pt states that she has been having pain since Saturday night around 8 pm. Pt states that she came to ED Sunday but LWBS. Pt states that she thinks this may be related to gout because she has been off her medication for 3 months. Pt is in NAD at this time.

## 2017-06-10 NOTE — Discharge Instructions (Addendum)
Follow-up with your lupus doctor if any continued problems.  For the next 3 days begin taking prednisone 30 mg once daily and then go back to your regular dose of prednisone.  Also read the information about potassium rich foods that you can eat.  If you have potassium tablets at home take these for the next 3 to 4 days.  Call your lupus doctor for further instructions.

## 2017-06-10 NOTE — ED Provider Notes (Signed)
[facility] Emergency Department Provider Note  ___________________________________________   First MD Initiated Contact with Patient 06/10/17 [phone]     (approximate)  I have reviewed the triage vital signs and the nursing notes.   HISTORY  Chief Complaint Hand Pain   HPI Faith Camacho is a 27 y.o. female is here with complaint of bilateral hand pain.  Patient states that she began having pain approximately 3 days ago without any history of injury.  Patient states that she has lupus and has a doctor at [facility].  She was taken any Purinol and this was discontinued back in March.  She also has history of hypokalemia but has not taken her potassium tablets and "a long time".  Patient states that she is worried that this could be gout.  She denies any fever, warmth or heat to the joints.  She was in the emergency department yesterday and was told that her potassium was low.  Patient left without being seen.  Currently she rates her pain as a 9/10.   Past Medical History:  Diagnosis Date  . Asthma   . Lupus (HCC)     There are no active problems to display for this patient.   Past Surgical History:  Procedure Laterality Date  . CHOLECYSTECTOMY    . LUNG BIOPSY    . RENAL BIOPSY      Prior to Admission medications   Medication Sig Start Date End Date Taking? Authorizing Provider  beclomethasone (QVAR) 80 MCG/ACT inhaler Inhale 2 puffs into the lungs 2 (two) times daily.    [provider]  calcium citrate-vitamin D (CITRACAL+D) 315-200 MG-UNIT tablet Take 1 tablet by mouth 2 (two) times daily.    [provider]  gabapentin (NEURONTIN) 300 MG capsule Take 300 mg by mouth 3 (three) times daily.    [provider]  ibuprofen (ADVIL,MOTRIN) 600 MG tablet Take 1 tablet (600 mg total) by mouth every 6 (six) hours as needed. 04/29/17   Hildred Laser, MD  losartan (COZAAR) 50 MG tablet Take 50 mg by mouth daily.    [provider]  mycophenolate (CELLCEPT) 500 MG tablet Take by mouth 2 (two) times daily.    [provider]  norethindrone-ethinyl estradiol-iron (JUNEL FE 1.5/30) 1.5-30 MG-MCG tablet Take 1 tablet by mouth daily. 04/30/17   Hildred Laser, MD  omeprazole (PRILOSEC) 20 MG capsule Take 20 mg by mouth daily.    [provider]  potassium chloride (K-DUR) 10 MEQ tablet Take 10 mEq by mouth daily.    [provider]  predniSONE (DELTASONE) 10 MG tablet Take 3 tablets once a day for 3 days and then go to your regular prednisone dosage 06/10/17   Tommi Rumps, PA-C  predniSONE (DELTASONE) 5 MG tablet Take 5 mg by mouth daily with breakfast.    [provider]  sodium bicarbonate 325 MG tablet Take 325 mg by mouth.    [provider]  topiramate (TOPAMAX) 25 MG tablet Take 25 mg by mouth 2 (two) times daily.    [provider]    Allergies Patient has no known allergies.  Family History  Problem Relation Age of Onset  . Hypertension Maternal Grandmother     Social History Social History   Tobacco Use  . Smoking status: Never Smoker  . Smokeless tobacco: Never Used  Substance Use Topics  . Alcohol use: No    Frequency: Never  . Drug use: No    Review of  Systems Constitutional: No fever/chills Cardiovascular: Denies chest pain. Respiratory: Denies shortness of breath. Genitourinary: Negative for dysuria. Musculoskeletal: Positive for bilateral hand pain.  Positive for bilateral wrist pain. Skin: Negative for rash. Neurological: Negative for headaches, focal weakness or numbness. ____________________________________________   PHYSICAL EXAM:  VITAL SIGNS: ED Triage Vitals  Enc Vitals Group     BP 06/10/17 0740 124/76     Pulse Rate 06/10/17 0740 82     Resp 06/10/17 0740 18     Temp 06/10/17 0740 97.6 F (36.4 C)     Temp Source 06/10/17 0740 Oral     SpO2 06/10/17 0740 99 %     Weight --      Height --      Head Circumference  --      Peak Flow --      Pain Score 06/10/17 0746 9     Pain Loc --      Pain Edu? --      Excl. in GC? --    Constitutional: Alert and oriented. Well appearing and in no acute distress. Eyes: Conjunctivae are normal.  Head: Atraumatic. Neck: No stridor.   Cardiovascular: Normal rate, regular rhythm. Grossly normal heart sounds.  Good peripheral circulation. Respiratory: Normal respiratory effort.  No retractions. Lungs CTAB. Musculoskeletal: Moves upper and lower extremities without any difficulty.  There is no gross deformity on examination of her wrist or hands bilaterally.  Range of motion is without crepitus or restriction.  Pain is reproduced with exertion bilateral wrist.  No erythema or edema is present.  No point tenderness is appreciated.  Capillary refill and motor sensory function intact.  No erythema, ecchymosis or abrasions were seen on the scan. Neurologic:  Normal speech and language. No gross focal neurologic deficits are appreciated. No gait instability. Skin:  Skin is warm, dry and intact. No rash noted. Psychiatric: Mood and affect are normal. Speech and behavior are normal.  ____________________________________________   LABS (all labs ordered are listed, but only abnormal results are displayed)  Labs Reviewed - No data to display  PROCEDURES  Procedure(s) performed: None  Procedures  Critical Care performed: No  ____________________________________________   INITIAL IMPRESSION / ASSESSMENT AND PLAN / ED COURSE  As part of my medical decision making, I reviewed the following data within the electronic MEDICAL RECORD NUMBER Notes from prior ED visits and [place] Controlled Substance Database  We discussed lab findings from her visit yesterday in which she left prior to results given.  Patient is minimally hypokalemic at 3.1.  She has potassium tablets at home which she will begin taking once again and follow-up with her lupus doctor and her arm.  Patient also  currently is taking prednisone 5 mg daily.  She is instructed to increase her prednisone to 30 mg once daily for the next 3 days and then return to her baseline dosage of prednisone 5 mg daily.  She was given information about potassium rich foods to include into her diet.  ____________________________________________   FINAL CLINICAL IMPRESSION(S) / ED DIAGNOSES  Final diagnoses:  Myofascial muscle pain  Chronic hypokalemia     ED Discharge Orders        Ordered    predniSONE (DELTASONE) 10 MG tablet     06/10/17 0981       Note:  This document was prepared using Dragon voice recognition software and may include unintentional dictation errors.    Tommi Rumps, PA-C 06/10/17 1914    Jene Every, MD  06/10/17 1317  

## 2017-06-10 NOTE — ED Notes (Signed)
First Nurse Note:  Patient states she was here [facility] for same complaint but left without being seen.

## 2017-06-10 NOTE — ED Notes (Signed)
See triage note  Presents with hand pain without injury  Feels like it is her gout  Has been off meds for about 3 months

## 2017-07-09 ENCOUNTER — Encounter: Payer: Self-pay | Admitting: Obstetrics and Gynecology

## 2017-07-09 ENCOUNTER — Ambulatory Visit (INDEPENDENT_AMBULATORY_CARE_PROVIDER_SITE_OTHER): Payer: Medicaid Other | Admitting: Obstetrics and Gynecology

## 2017-07-09 VITALS — BP 110/76 | HR 73 | Ht 59.0 in | Wt 157.2 lb

## 2017-07-09 DIAGNOSIS — M329 Systemic lupus erythematosus, unspecified: Secondary | ICD-10-CM | POA: Diagnosis not present

## 2017-07-09 DIAGNOSIS — E876 Hypokalemia: Secondary | ICD-10-CM

## 2017-07-09 DIAGNOSIS — Z8742 Personal history of other diseases of the female genital tract: Secondary | ICD-10-CM | POA: Insufficient documentation

## 2017-07-09 DIAGNOSIS — Z01419 Encounter for gynecological examination (general) (routine) without abnormal findings: Secondary | ICD-10-CM | POA: Diagnosis not present

## 2017-07-09 DIAGNOSIS — IMO0002 Reserved for concepts with insufficient information to code with codable children: Secondary | ICD-10-CM

## 2017-07-09 DIAGNOSIS — N946 Dysmenorrhea, unspecified: Secondary | ICD-10-CM | POA: Diagnosis not present

## 2017-07-09 DIAGNOSIS — Z87898 Personal history of other specified conditions: Secondary | ICD-10-CM | POA: Diagnosis not present

## 2017-07-09 DIAGNOSIS — Z3202 Encounter for pregnancy test, result negative: Secondary | ICD-10-CM | POA: Diagnosis not present

## 2017-07-09 DIAGNOSIS — Z3041 Encounter for surveillance of contraceptive pills: Secondary | ICD-10-CM

## 2017-07-09 LAB — POCT URINE PREGNANCY: Preg Test, Ur: NEGATIVE

## 2017-07-09 NOTE — Progress Notes (Signed)
GYNECOLOGY ANNUAL PHYSICAL EXAM PROGRESS NOTE  Subjective:    Faith Camacho is a 27 y.o. G0P0000 female who presents for an annual exam and f/u with dysmenorrhea and new OCP. The patient has no complaints today. The patient is sexually active.  The patient wears seatbelts: yes. The patient participates in regular exercise: no. Has the patient ever been transfused or tattooed?: no. The patient reports that there is not domestic violence in her life.   Of note, patient notes that dysmenorrhea has improved on new OCP.  Has not had any side effects from new medication. Does report that she did not have a period this past month.  Denies missing any pills but still desires pregnancy test today to be sure. Does state that her PCP recommended that she not be on NSAIDs right now due to her medical issues. Had to stop taking the Ibuprofen.   Gynecologic History Patient's last menstrual period was 05/25/2017. Menstrual History: OB History    Gravida  0   Para  0   Term  0   Preterm  0   AB  0   Living  0     SAB  0   TAB  0   Ectopic  0   Multiple  0   Live Births  0           Menarche age: 41 Patient's last menstrual period was 05/25/2017. Contraception: OCP (estrogen/progesterone) History of STI's: Denies Last Pap: 2018. Results were: normal.  Does have h/o 1 prior abnormal pap smear.     OB History  Gravida Para Term Preterm AB Living  0 0 0 0 0 0  SAB TAB Ectopic Multiple Live Births  0 0 0 0 0    Past Medical History:  Diagnosis Date  . Asthma   . Lupus [facility])     Past Surgical History:  Procedure Laterality Date  . CHOLECYSTECTOMY    . LUNG BIOPSY    . RENAL BIOPSY      Family History  Problem Relation Age of Onset  . Hypertension Maternal Grandmother     Social History   Socioeconomic History  . Marital status: Single    Spouse name: Not on file  . Number of children: Not on file  . Years of education: Not on file  . Highest education  level: Not on file  Occupational History  . Not on file  Social Needs  . Financial resource strain: Not on file  . Food insecurity:    Worry: Not on file    Inability: Not on file  . Transportation needs:    Medical: Not on file    Non-medical: Not on file  Tobacco Use  . Smoking status: Never Smoker  . Smokeless tobacco: Never Used  Substance and Sexual Activity  . Alcohol use: Yes    Frequency: Never    Comment: occas  . Drug use: No  . Sexual activity: Yes    Birth control/protection: Pill  Lifestyle  . Physical activity:    Days per week: Not on file    Minutes per session: Not on file  . Stress: Not on file  Relationships  . Social connections:    Talks on phone: Not on file    Gets together: Not on file    Attends religious service: Not on file    Active member of club or organization: Not on file    Attends meetings of clubs or organizations: Not on  file    Relationship status: Not on file  . Intimate partner violence:    Fear of current or ex partner: Not on file    Emotionally abused: Not on file    Physically abused: Not on file    Forced sexual activity: Not on file  Other Topics Concern  . Not on file  Social History Narrative  . Not on file    Current Outpatient Medications on File Prior to Visit  Medication Sig Dispense Refill  . beclomethasone (QVAR) 80 MCG/ACT inhaler Inhale 2 puffs into the lungs 2 (two) times daily.    . calcium citrate-vitamin D (CITRACAL+D) 315-200 MG-UNIT tablet Take 1 tablet by mouth 2 (two) times daily.    Marland Kitchen gabapentin (NEURONTIN) 300 MG capsule Take 300 mg by mouth 3 (three) times daily.    Marland Kitchen ibuprofen (ADVIL,MOTRIN) 600 MG tablet Take 1 tablet (600 mg total) by mouth every 6 (six) hours as needed. 60 tablet 3  . losartan (COZAAR) 50 MG tablet Take 50 mg by mouth daily.    . mycophenolate (CELLCEPT) 500 MG tablet Take by mouth 2 (two) times daily.    . norethindrone-ethinyl estradiol-iron (JUNEL FE 1.5/30) 1.5-30 MG-MCG  tablet Take 1 tablet by mouth daily. 1 Package 11  . omeprazole (PRILOSEC) 20 MG capsule Take 20 mg by mouth daily.    . potassium chloride (K-DUR) 10 MEQ tablet Take 10 mEq by mouth daily.    . predniSONE (DELTASONE) 5 MG tablet Take 5 mg by mouth daily with breakfast.    . sodium bicarbonate 325 MG tablet Take 325 mg by mouth.    . topiramate (TOPAMAX) 25 MG tablet Take 25 mg by mouth 2 (two) times daily.    . predniSONE (DELTASONE) 10 MG tablet Take 3 tablets once a day for 3 days and then go to your regular prednisone dosage (Patient not taking: Reported on 07/09/2017) 9 tablet 0   No current facility-administered medications on file prior to visit.     No Known Allergies    Review of Systems Constitutional: negative for chills, fatigue, fevers and sweats Eyes: negative for irritation, redness and visual disturbance Ears, nose, mouth, throat, and face: negative for hearing loss, nasal congestion, snoring and tinnitus Respiratory: negative for asthma, cough, sputum Cardiovascular: negative for chest pain, dyspnea, exertional chest pressure/discomfort, irregular heart beat, palpitations and syncope Gastrointestinal: negative for abdominal pain, change in bowel habits, nausea and vomiting Genitourinary: negative for abnormal menstrual periods, genital lesions, sexual problems and vaginal discharge, dysuria and urinary incontinence.  Positive for dysmenorrhea (improved since last visit).  Integument/breast: negative for breast lump, breast tenderness and nipple discharge Hematologic/lymphatic: negative for bleeding and easy bruising Musculoskeletal:negative for back pain and muscle weakness Neurological: negative for dizziness, headaches, vertigo and weakness Endocrine: negative for diabetic symptoms including polydipsia, polyuria and skin dryness Allergic/Immunologic: negative for hay fever and urticaria        Objective:  Blood pressure 110/76, pulse 73, height  (1.499 m),  weight 157 lb 3.2 oz (71.3 kg), last menstrual period 05/25/2017. Body mass index is 31.75 kg/m.  General Appearance:    Alert, cooperative, no distress, appears stated age, mild obesity  Head:    Normocephalic, without obvious abnormality, atraumatic  Eyes:    PERRL, conjunctiva/corneas clear, EOM's intact, both eyes  Ears:    Normal external ear canals, both ears  Nose:   Nares normal, septum midline, mucosa normal, no drainage or sinus tenderness  Throat:   Lips, mucosa,  and tongue normal; teeth and gums normal  Neck:   Supple, symmetrical, trachea midline, no adenopathy; thyroid: no enlargement/tenderness/nodules; no carotid bruit or JVD  Back:     Symmetric, no curvature, ROM normal, no CVA tenderness  Lungs:     Clear to auscultation bilaterally, respirations unlabored  Chest Wall:    No tenderness or deformity   Heart:    Regular rate and rhythm, S1 and S2 normal, no murmur, rub or gallop  Breast Exam:    No tenderness, masses, or nipple abnormality  Abdomen:     Soft, non-tender, bowel sounds active all four quadrants, no masses, no organomegaly.    Genitalia:    Pelvic:external genitalia normal, vagina without lesions, discharge, or tenderness, rectovaginal septum  normal. Cervix normal in appearance, no cervical motion tenderness, no adnexal masses or tenderness.  Uterus normal size, shape, mobile, regular contours, nontender.  Rectal:    Normal external sphincter.  No hemorrhoids appreciated. Internal exam not done.   Extremities:   Extremities normal, atraumatic, no cyanosis or edema  Pulses:   2+ and symmetric all extremities  Skin:   Skin color, texture, turgor normal, no rashes or lesions  Lymph nodes:   Cervical, supraclavicular, and axillary nodes normal  Neurologic:   CNII-XII intact, normal strength, sensation and reflexes throughout   .  Labs:  Lab Results  Component Value Date   WBC 5.0 06/09/2017   HGB 15.1 06/09/2017   HCT 45.4 06/09/2017   MCV 91.4 06/09/2017     PLT 234 06/09/2017    Lab Results  Component Value Date   CREATININE 0.80 06/09/2017   BUN 14 06/09/2017   NA 135 06/09/2017   K 3.1 (L) 06/09/2017   CL 104 06/09/2017   CO2 21 (L) 06/09/2017    Lab Results  Component Value Date   ALT 15 04/13/2017   AST 18 04/13/2017   ALKPHOS 56 04/13/2017   BILITOT 0.8 04/13/2017    No results found for: TSH  Results for orders placed or performed in visit on 07/09/17  POCT urine pregnancy  Result Value Ref Range   Preg Test, Ur Negative Negative     Assessment:    Healthy female exam.   Mild hypokalemia Contraception surveillance Dysmenorrhea H/o abnormal pap smear Lupus  Plan:     Blood tests: Labs recently performed last month. Mild hypokalemia noted, advised on increasing with dietary intake of potassium.  Was initiated on K-dur by PCP.  Breast self exam technique reviewed and patient encouraged to perform self-exam monthly. Contraception: OCP (estrogen/progesterone).  Patient notes that new OCPs are working well for her and will continue.  Also has helped her dysmenorrhea.  She has stopped taking NSAIDs due to recommendations from PCP, advised on Tylenol, Midol prn. UPT negative today, likely missed menses due to contraceptive pill.  Discussed healthy lifestyle modifications. Lupus managed by PCP.  Pap smear performed today due to h/o recent abnormal pap smear.  Last pap last year was normal. If next year is normal, can resume q 3 year screening.  Follow up in 1 year.   Hildred Laser, MD [facility]

## 2017-07-09 NOTE — Patient Instructions (Signed)

## 2017-07-09 NOTE — Progress Notes (Signed)
Pt is present today for her annual exam. Pt denies itching, burning or discharge. Pt stated that she doing well only concern is he PCP stated that she didn't need to be taking ibuprofen so she needs another pain med to replace this one. No other concerns.  Pregnancy test done and it was negative.

## 2017-07-16 DIAGNOSIS — M2141 Flat foot [pes planus] (acquired), right foot: Secondary | ICD-10-CM | POA: Diagnosis not present

## 2017-07-16 DIAGNOSIS — M79671 Pain in right foot: Secondary | ICD-10-CM | POA: Diagnosis not present

## 2017-07-18 LAB — PAP IG, CT-NG, RFX HPV ASCU
Chlamydia, Nuc. Acid Amp: NEGATIVE
Gonococcus by Nucleic Acid Amp: NEGATIVE
PAP SMEAR COMMENT: 0

## 2017-07-18 LAB — HPV DNA PROBE HIGH RISK, AMPLIFIED: HPV, HIGH-RISK: POSITIVE — AB

## 2017-07-19 ENCOUNTER — Encounter: Payer: Self-pay | Admitting: Obstetrics and Gynecology

## 2017-07-21 ENCOUNTER — Other Ambulatory Visit: Payer: Self-pay

## 2017-07-24 ENCOUNTER — Other Ambulatory Visit: Payer: Self-pay

## 2017-07-24 MED ORDER — FLUCONAZOLE 150 MG PO TABS: 150.0000 mg | ORAL_TABLET | Freq: Once | ORAL | 0 refills | Status: AC

## 2017-08-01 ENCOUNTER — Emergency Department
Admission: EM | Admit: 2017-08-01 | Discharge: 2017-08-01 | Disposition: A | Discharge status: Home / Self Care | Payer: Medicaid Other | Attending: Emergency Medicine | Admitting: Emergency Medicine

## 2017-08-01 ENCOUNTER — Emergency Department: Payer: Medicaid Other

## 2017-08-01 ENCOUNTER — Encounter: Payer: Self-pay | Admitting: Emergency Medicine

## 2017-08-01 DIAGNOSIS — Z79899 Other long term (current) drug therapy: Secondary | ICD-10-CM | POA: Insufficient documentation

## 2017-08-01 DIAGNOSIS — J189 Pneumonia, unspecified organism: Secondary | ICD-10-CM | POA: Diagnosis not present

## 2017-08-01 DIAGNOSIS — J45909 Unspecified asthma, uncomplicated: Secondary | ICD-10-CM | POA: Insufficient documentation

## 2017-08-01 DIAGNOSIS — R079 Chest pain, unspecified: Secondary | ICD-10-CM | POA: Diagnosis present

## 2017-08-01 LAB — CBC
HEMATOCRIT: 44.9 % (ref 35.0–47.0)
Hemoglobin: 14.8 g/dL (ref 12.0–16.0)
MCH: 30.3 pg (ref 26.0–34.0)
MCHC: 32.9 g/dL (ref 32.0–36.0)
MCV: 92 fL (ref 80.0–100.0)
Platelets: 256 10*3/uL (ref 150–440)
RBC: 4.88 MIL/uL (ref 3.80–5.20)
RDW: 14.1 % (ref 11.5–14.5)
WBC: 4.4 10*3/uL (ref 3.6–11.0)

## 2017-08-01 LAB — TROPONIN I: Troponin I: <0.03 ng/mL (ref <0.03)

## 2017-08-01 LAB — BASIC METABOLIC PANEL
Anion gap: 8 (ref 5–15)
BUN: 14 mg/dL (ref 6–20)
CO2: 23 mmol/L (ref 22–32)
Calcium: 8.3 mg/dL — ABNORMAL LOW (ref 8.9–10.3)
Chloride: 108 mmol/L (ref 101–111)
Creatinine, Ser: 0.72 mg/dL (ref 0.44–1.00)
GFR calc Af Amer: >60 mL/min (ref >60)
GFR calc non Af Amer: >60 mL/min (ref >60)
GLUCOSE: 97 mg/dL (ref 65–99)
POTASSIUM: 3.9 mmol/L (ref 3.5–5.1)
Sodium: 139 mmol/L (ref 135–145)

## 2017-08-01 LAB — POCT PREGNANCY, URINE: Preg Test, Ur: NEGATIVE

## 2017-08-01 MED ORDER — PREDNISONE 50 MG PO TABS: 50.0000 mg | ORAL_TABLET | Freq: Every day | ORAL | 0 refills | Status: DC

## 2017-08-01 MED ORDER — ALBUTEROL SULFATE HFA 108 (90 BASE) MCG/ACT IN AERS: 2.0000 | INHALATION_SPRAY | RESPIRATORY_TRACT | 0 refills | Status: AC | PRN

## 2017-08-01 NOTE — ED Provider Notes (Signed)
[facility] Emergency Department Provider Note  ____________________________________________  Time seen: Approximately 3:46 PM  I have reviewed the triage vital signs and the nursing notes.   HISTORY  Chief Complaint Chest Pain    HPI Faith Camacho is a 27 y.o. female who presents the emergency department complaining of sharp mid chest pain worsened with inspiration.  Patient reports that over the past week she has had some nasal congestion with mild associated cough.  No distinct fevers or chills, sore throat, chest pain, shortness of breath, abdominal pain, nausea vomiting over the past week.  Today, patient began to experience some central chest pain.  Patient reports that it is sharp, worsened with inspiration.  Patient denies any shortness of breath at this time.  She states that palpation of her chest does not elicit or reproduce her pain.  Patient denies any headache, vision changes, neck pain or stiffness, abdominal pain, nausea or vomiting.  Patient took Flonase for her URI symptoms but has taken no other medications for her previous weeks worth of complaints or today.  Patient reports that she does have a history of lupus and because of this diagnosis she is "unsure what medications I can take."    Past Medical History:  Diagnosis Date  . Asthma   . Lupus [facility](HCC)     Patient Active Problem List   Diagnosis Date Noted  . History of abnormal cervical Pap smear 07/09/2017  . Lupus (HCC) 07/09/2017  . Dysmenorrhea 07/09/2017    Past Surgical History:  Procedure Laterality Date  . CHOLECYSTECTOMY    . LUNG BIOPSY    . RENAL BIOPSY      Prior to Admission medications   Medication Sig Start Date End Date Taking? Authorizing Provider  albuterol (PROVENTIL HFA;VENTOLIN HFA) 108 (90 Base) MCG/ACT inhaler Inhale 2 puffs into the lungs every 4 (four) hours as needed for wheezing or shortness of breath. 08/01/17   Lenna Hagarty, Delorise RoyalsJonathan D, PA-C   beclomethasone (QVAR) 80 MCG/ACT inhaler Inhale 2 puffs into the lungs 2 (two) times daily.    [provider]  calcium citrate-vitamin D (CITRACAL+D) 315-200 MG-UNIT tablet Take 1 tablet by mouth 2 (two) times daily.    [provider]  gabapentin (NEURONTIN) 300 MG capsule Take 300 mg by mouth 3 (three) times daily.    [provider]  ibuprofen (ADVIL,MOTRIN) 600 MG tablet Take 1 tablet (600 mg total) by mouth every 6 (six) hours as needed. 04/29/17   Hildred Laserherry, Anika, MD  losartan (COZAAR) 50 MG tablet Take 50 mg by mouth daily.    [provider]  mycophenolate (CELLCEPT) 500 MG tablet Take by mouth 2 (two) times daily.    [provider]  norethindrone-ethinyl estradiol-iron (JUNEL FE 1.5/30) 1.5-30 MG-MCG tablet Take 1 tablet by mouth daily. 04/30/17   Hildred Laserherry, Anika, MD  omeprazole (PRILOSEC) 20 MG capsule Take 20 mg by mouth daily.    [provider]  potassium chloride (K-DUR) 10 MEQ tablet Take 10 mEq by mouth daily.    [provider]  predniSONE (DELTASONE) 50 MG tablet Take 1 tablet (50 mg total) by mouth daily with breakfast. 08/01/17   Maleeyah Mccaughey, Delorise RoyalsJonathan D, PA-C  sodium bicarbonate 325 MG tablet Take 325 mg by mouth.    [provider]  topiramate (TOPAMAX) 25 MG tablet Take 25 mg by mouth 2 (two) times daily.    [provider]    Allergies Patient has no known allergies.  Family History  Problem  Relation Age of Onset  . Hypertension Maternal Grandmother     Social History Social History   Tobacco Use  . Smoking status: Never Smoker  . Smokeless tobacco: Never Used  Substance Use Topics  . Alcohol use: Yes    Frequency: Never    Comment: occas  . Drug use: No     Review of Systems  Constitutional: No fever/chills Eyes: No visual changes. No discharge ENT: Resolving nasal congestion. Cardiovascular: Central sharp chest pain. Respiratory: Mild sporadic cough. No SOB. Gastrointestinal:  No abdominal pain.  No nausea, no vomiting.  No diarrhea.  No constipation. Genitourinary: Negative for dysuria. No hematuria Musculoskeletal: Negative for musculoskeletal pain. Skin: Negative for rash, abrasions, lacerations, ecchymosis. Neurological: Negative for headaches, focal weakness or numbness. 10-point ROS otherwise negative.  ____________________________________________   PHYSICAL EXAM:  VITAL SIGNS: ED Triage Vitals  Enc Vitals Group     BP 08/01/17 1311 123/80     Pulse Rate 08/01/17 1311 76     Resp 08/01/17 1311 15     Temp 08/01/17 1311 99 F (37.2 C)     Temp Source 08/01/17 1311 Oral     SpO2 08/01/17 1311 98 %     Weight 08/01/17 1312 158 lb (71.7 kg)     Height 08/01/17 1312 4\' 11"  (1.499 m)     Head Circumference --      Peak Flow --      Pain Score 08/01/17 1312 8     Pain Loc --      Pain Edu? --      Excl. in GC? --      Constitutional: Alert and oriented. Well appearing and in no acute distress. Eyes: Conjunctivae are normal. PERRL. EOMI. Head: Atraumatic. ENT:      Ears: EACs and TMs unremarkable bilaterally.      Nose: Mild clear congestion/rhinnorhea.      Mouth/Throat: Mucous membranes are moist.  Oropharynx is nonerythematous and nonedematous.  Uvula is midline. Neck: No stridor.  Neck is supple full range of motion Hematological/Lymphatic/Immunilogical: No cervical lymphadenopathy. Cardiovascular: Normal rate, regular rhythm. Normal S1 and S2.  Good peripheral circulation. Respiratory: Normal respiratory effort without tachypnea or retractions. Lungs with a few mild crackles, minimal expiratory wheezing to central lung field.  No other adventitious lung sounds.Peri Jefferson air entry to the bases with no decreased or absent breath sounds. Musculoskeletal: Full range of motion to all extremities. No gross deformities appreciated. Neurologic:  Normal speech and language. No gross focal neurologic deficits are appreciated.  Skin:  Skin is warm, dry  and intact. No rash noted. Psychiatric: Mood and affect are normal. Speech and behavior are normal. Patient exhibits appropriate insight and judgement.   ____________________________________________   LABS (all labs ordered are listed, but only abnormal results are displayed)  Labs Reviewed  BASIC METABOLIC PANEL - Abnormal; Notable for the following components:      Result Value   Calcium 8.3 (*)    All other components within normal limits  CBC  TROPONIN I  POC URINE PREG, ED  POCT PREGNANCY, URINE   ____________________________________________  EKG  ED ECG REPORT I, Delorise Royals Mahathi Pokorney,  personally viewed and interpreted this ECG.   Date: 08/01/2017  EKG Time: 1310 hrs.  Rate: 66 bpm  Rhythm: normal EKG, normal sinus rhythm, unchanged from previous tracings  Axis: Normal axis  Intervals:none  ST&T Change: No ST elevation or depression noted.  Nonspecific T wave changes in leads III, V1, V3.  ____________________________________________  RADIOLOGY I personally viewed and evaluated these images as part of my medical decision making, as well as reviewing the written report by the radiologist.  Concur with radiologist finding of pulmonary interstitial prominence without effusion.  No other acute cardiopulmonary abnormality identified.  Dg Chest 2 View  Result Date: 08/01/2017 CLINICAL DATA:  Chest pain. EXAM: CHEST - 2 VIEW COMPARISON:  02/17/2017. FINDINGS: Mediastinum and hilar structures normal. Bilateral interstitial prominence noted. Pneumonitis cannot be excluded. No pleural effusion or pneumothorax. Heart size normal. No acute bony abnormality. IMPRESSION: Bilateral pulmonary interstitial prominence noted. Pneumonitis cannot be excluded. Electronically Signed   By: Maisie Fus  Register   On: 08/01/2017 13:50    ____________________________________________    PROCEDURES  Procedure(s) performed:    Procedures    Medications - No data to  display   ____________________________________________   INITIAL IMPRESSION / ASSESSMENT AND PLAN / ED COURSE  Pertinent labs & imaging results that were available during my care of the patient were reviewed by me and considered in my medical decision making (see chart for details).  Review of the [place] CSRS was performed in accordance of the [facility] prior to dispensing any controlled drugs.      Patient's diagnosis is consistent with pneumonitis.  Patient presents the emergency department complaining of central sharp chest pain worsened with inspiration.  Initial differential included MI, cardiac ischemia, pericarditis, bronchitis, pneumonia, pneumothorax, GERD.  On exam, exam was reassuring with no acute findings.  Labs are reassuring.  EKG is reassuring.  At this time, patient does have findings on chest x-ray consistent with pneumonitis.  This is consistent given patient's recent URI symptoms, exam findings.  At this time, patient will be treated with short course of steroid and albuterol.  Patient is advised to continue Flonase should this be a hypersensitivity pneumonitis to airborne allergens.  No further work-up deemed necessary at this time.  Patient is stable for discharge.  Patient will follow primary care as needed. Patient is given ED precautions to return to the ED for any worsening or new symptoms.     ____________________________________________  FINAL CLINICAL IMPRESSION(S) / ED DIAGNOSES  Final diagnoses:  Pneumonitis      NEW MEDICATIONS STARTED DURING THIS VISIT:  ED Discharge Orders        Ordered    predniSONE (DELTASONE) 50 MG tablet  Daily with breakfast     08/01/17 1558    albuterol (PROVENTIL HFA;VENTOLIN HFA) 108 (90 Base) MCG/ACT inhaler  Every 4 hours PRN     08/01/17 1558          This chart was dictated using voice recognition software/Dragon. Despite best efforts to proofread, errors can occur which can change the meaning. Any change was purely  unintentional.    Racheal Patches, PA-C 08/01/17 1559    Minna Antis, MD 08/01/17 2340

## 2017-08-01 NOTE — ED Triage Notes (Signed)
Patient presents to ED via EMS from home with c/o CP that began today. Patient describes the pain as a sharp, central pain that is worse with inspiration. Patient also reports shortness of breath. Ambulatory to room without difficulty. Able to speak full, complete sentences.

## 2017-08-01 NOTE — ED Notes (Signed)
See triage note states she developed sharp pain to mid chest this am   States she has had cold sx's about 1 week ago  Low grade fever noted on arrival   Pain increases with inspiration

## 2017-08-05 DIAGNOSIS — H5213 Myopia, bilateral: Secondary | ICD-10-CM | POA: Diagnosis not present

## 2017-09-05 ENCOUNTER — Encounter: Payer: Self-pay | Admitting: Obstetrics and Gynecology

## 2017-09-09 ENCOUNTER — Encounter: Payer: Self-pay | Admitting: Obstetrics and Gynecology

## 2017-09-09 ENCOUNTER — Ambulatory Visit (INDEPENDENT_AMBULATORY_CARE_PROVIDER_SITE_OTHER): Payer: Medicaid Other | Admitting: Obstetrics and Gynecology

## 2017-09-09 ENCOUNTER — Other Ambulatory Visit (HOSPITAL_COMMUNITY)
Admission: RE | Admit: 2017-09-09 | Discharge: 2017-09-09 | Disposition: A | Discharge status: Home / Self Care | Payer: Medicaid Other | Source: Ambulatory Visit | Attending: Obstetrics and Gynecology | Admitting: Obstetrics and Gynecology

## 2017-09-09 VITALS — BP 103/72 | HR 78 | Ht 59.0 in | Wt 162.4 lb

## 2017-09-09 DIAGNOSIS — N888 Other specified noninflammatory disorders of cervix uteri: Secondary | ICD-10-CM | POA: Diagnosis not present

## 2017-09-09 DIAGNOSIS — R87610 Atypical squamous cells of undetermined significance on cytologic smear of cervix (ASC-US): Secondary | ICD-10-CM

## 2017-09-09 DIAGNOSIS — R87810 Cervical high risk human papillomavirus (HPV) DNA test positive: Secondary | ICD-10-CM

## 2017-09-09 NOTE — Progress Notes (Signed)
    GYNECOLOGY CLINIC COLPOSCOPY PROCEDURE NOTE  27 y.o. G0P0000 here for colposcopy for ASCUS with POSITIVE high risk HPV pap smear on 07/09/2017. Denies prior history of abnormal pap smears. Discussed role for HPV in cervical dysplasia, need for surveillance.  Patient given informed consent.  Placed in lithotomy position. Cervix viewed with speculum and colposcope after application of acetic acid.   Colposcopy adequate? Yes  no visible lesions, no mosaicism, no punctation and no abnormal vasculature; no biopsies obtained.  ECC specimen obtained. All specimens were labeled and sent to pathology.   Patient was given post procedure instructions.  Will follow up pathology and manage accordingly; patient will be contacted with results and recommendations.  Routine preventative health maintenance measures emphasized.     Hildred Laserherry, Jermiah Howton, MD [facility]

## 2017-09-09 NOTE — Progress Notes (Signed)
Pt stated that she is doing well.  

## 2017-09-09 NOTE — Patient Instructions (Signed)
Colposcopy, Care After  This sheet gives you information about how to care for yourself after your procedure. Your doctor may also give you more specific instructions. If you have problems or questions, contact your doctor.  What can I expect after the procedure?  If you did not have a tissue sample removed (did not have a biopsy), you may only have some spotting for a few days. You can go back to your normal activities.  If you had a tissue sample removed, it is common to have:  · Soreness and pain. This may last for a few days.  · Light-headedness.  · Mild bleeding from your vagina or dark-colored, grainy discharge from your vagina. This may last for a few days. You may need to wear a sanitary pad.  · Spotting for at least 48 hours after the procedure.    Follow these instructions at home:  · Take over-the-counter and prescription medicines only as told by your doctor. Ask your doctor what medicines you can start taking again. This is very important if you take blood-thinning medicine.  · Do not drive or use heavy machinery while taking prescription pain medicine.  · For 3 days, or as long as your doctor tells you, avoid:  ? Douching.  ? Using tampons.  ? Having sex.  · If you use birth control (contraception), keep using it.  · Limit activity for the first day after the procedure. Ask your doctor what activities are safe for you.  · It is up to you to get the results of your procedure. Ask your doctor when your results will be ready.  · Keep all follow-up visits as told by your doctor. This is important.  Contact a doctor if:  · You get a skin rash.  Get help right away if:  · You are bleeding a lot from your vagina. It is a lot of bleeding if you are using more than one pad an hour for 2 hours in a row.  · You have clumps of blood (blood clots) coming from your vagina.  · You have a fever.  · You have chills  · You have pain in your lower belly (pelvic area).  · You have signs of infection, such as vaginal  discharge that is:  ? Different than usual.  ? Yellow.  ? Bad-smelling.  · You have very pain or cramps in your lower belly that do not get better with medicine.  · You feel light-headed.  · You feel dizzy.  · You pass out (faint).  Summary  · If you did not have a tissue sample removed (did not have a biopsy), you may only have some spotting for a few days. You can go back to your normal activities.  · If you had a tissue sample removed, it is common to have mild pain and spotting for 48 hours.  · For 3 days, or as long as your doctor tells you, avoid douching, using tampons and having sex.  · Get help right away if you have bleeding, very bad pain, or signs of infection.  This information is not intended to replace advice given to you by your health care provider. Make sure you discuss any questions you have with your health care provider.  Document Released: 07/17/2007 Document Revised: 10/18/2015 Document Reviewed: 10/18/2015  Elsevier Interactive Patient Education © 2018 [employer]

## 2017-09-10 ENCOUNTER — Encounter: Payer: Self-pay | Admitting: Obstetrics and Gynecology

## 2017-09-14 ENCOUNTER — Encounter: Payer: Self-pay | Admitting: Obstetrics and Gynecology

## 2017-09-16 ENCOUNTER — Encounter: Payer: Self-pay | Admitting: Neurology

## 2017-09-16 ENCOUNTER — Ambulatory Visit (INDEPENDENT_AMBULATORY_CARE_PROVIDER_SITE_OTHER): Payer: Medicaid Other | Admitting: Neurology

## 2017-09-16 ENCOUNTER — Encounter

## 2017-09-16 VITALS — BP 108/68 | HR 77 | Ht 59.0 in | Wt 163.0 lb

## 2017-09-16 DIAGNOSIS — G43009 Migraine without aura, not intractable, without status migrainosus: Secondary | ICD-10-CM

## 2017-09-16 MED ORDER — RIZATRIPTAN BENZOATE 5 MG PO TABS: ORAL_TABLET | ORAL | 3 refills | Status: DC

## 2017-09-16 MED ORDER — ERENUMAB-AOOE 140 MG/ML ~~LOC~~ SOAJ: 140.0000 mg | SUBCUTANEOUS | 11 refills | Status: DC

## 2017-09-16 NOTE — Addendum Note (Signed)
Addended by: Dorthy CoolerBURNS, Braylynn Lewing J on: 09/16/2017 09:33 AM   Modules accepted: Orders

## 2017-09-16 NOTE — Progress Notes (Signed)
NEUROLOGY CONSULTATION NOTE  Pilar Westergaard MRN: [id number] DOB: 09/04/1990  Referring provider: Marvia Pickles, MD Primary care provider: no PCP  Reason for consult:  migraines  HISTORY OF PRESENT ILLNESS: Faith Camacho is a 27 year old right-handed female with SLE, chronic glomerulonephritis, chronic interstitial pneumonitis, asthma, migraines and history of Bell's palsy who presents for headache.  History supplemented by prior neurologist's notes.  She moved to [place] from [place].  Onset:  teenager Location:  Bi-temporal Quality:  Sharp, pounding Intensity:  severe.  She denies new headache, thunderclap headache or severe headache that wakes her from sleep. Aura:  no Prodrome:  no Postdrome:  no Associated symptoms:  phonophobia.  She denies nausea, vomiting, photophobia, osmophobia, visual disturbance or autonomic symptoms.  She denies associated unilateral numbness or weakness. Duration:  1 day Frequency:  10 days a month Triggers:  no Exacerbating factors:  no Relieving factors:  rest Activity:  aggravates  Current NSAIDS/steroids:  ibuprofen 600mg  (for cramps), prednisone 50mg  Current analgesics:  Excedrin Current triptans:  no Current ergotamine:  no Current anti-emetic:  no Current muscle relaxants:  no Current anti-anxiolytic:  no Current sleep aide:  no Current Antihypertensive medications:  losartan 50mg  Current Antidepressant medications:  no Current Anticonvulsant medications:  topiramate 25mg  twice daily, gabapentin 300mg  two times daily Current anti-CGRP:  Aimovig 70mg  (restarted last month) Current Vitamins/Herbal/Supplements:  no Current Antihistamines/Decongestants:  no Other therapy:  no Hormone/birth control:  Junel Fe Other medication:  Cellcept  Past NSAIDS:  Naproxen (years ago) Past analgesics:  Tramadol (adverse effects), acetaminophen 650mg  Past abortive triptans:  Sumatriptan (ineffective, makes her tired), rizatriptan 10mg   (ineffective, makes her tired), Relpax (ineffective, makes her tired) Past abortive ergotamine:  no Past muscle relaxants:  no Past anti-emetic:  no Past antihypertensive medications:  no Past antidepressant medications:  no Past anticonvulsant medications:  no Past anti-CGRP:  Aimovig 140mg  (effective) Past vitamins/Herbal/Supplements:  no Past antihistamines/decongestants:  no Other past therapies:  no  Caffeine:  8-9 bottles Mt Dew daily, 1 cup coffee 3-4 days a week,  Alcohol:  occasional Smoker:  no Diet:  8-9 bottles soda daily Exercise:  no Depression:  no; Anxiety:  yes Other pain:  Joint pain Sleep hygiene:  okay Family history of headache:  mother  08/01/17 BMP: Na 139, K 3.9, glucose 97, BUN 14, Cr 0.72  PAST MEDICAL HISTORY: Past Medical History:  Diagnosis Date  . Asthma   . Lupus (HCC)     PAST SURGICAL HISTORY: Past Surgical History:  Procedure Laterality Date  . CHOLECYSTECTOMY    . LUNG BIOPSY    . RENAL BIOPSY      MEDICATIONS: Current Outpatient Medications on File Prior to Visit  Medication Sig Dispense Refill  . albuterol (PROVENTIL HFA;VENTOLIN HFA) 108 (90 Base) MCG/ACT inhaler Inhale 2 puffs into the lungs every 4 (four) hours as needed for wheezing or shortness of breath. 1 Inhaler 0  . beclomethasone (QVAR) 80 MCG/ACT inhaler Inhale 2 puffs into the lungs 2 (two) times daily.    . calcium citrate-vitamin D (CITRACAL+D) 315-200 MG-UNIT tablet Take 1 tablet by mouth 2 (two) times daily.    Marland Kitchen gabapentin (NEURONTIN) 300 MG capsule Take 300 mg by mouth 2 (two) times daily.     Marland Kitchen ibuprofen (ADVIL,MOTRIN) 600 MG tablet Take 1 tablet (600 mg total) by mouth every 6 (six) hours as needed. 60 tablet 3  . losartan (COZAAR) 50 MG tablet Take 50 mg by mouth daily.    . mycophenolate (  CELLCEPT) 500 MG tablet Take by mouth 2 (two) times daily.    . norethindrone-ethinyl estradiol-iron (JUNEL FE 1.5/30) 1.5-30 MG-MCG tablet Take 1 tablet by mouth daily. 1  Package 11  . omeprazole (PRILOSEC) 20 MG capsule Take 20 mg by mouth daily.    . potassium chloride (K-DUR) 10 MEQ tablet Take 10 mEq by mouth daily.    . predniSONE (DELTASONE) 50 MG tablet Take 1 tablet (50 mg total) by mouth daily with breakfast. 5 tablet 0  . sodium bicarbonate 325 MG tablet Take 325 mg by mouth.    . topiramate (TOPAMAX) 25 MG tablet Take 25 mg by mouth 2 (two) times daily.     No current facility-administered medications on file prior to visit.     ALLERGIES: Allergies  Allergen Reactions  . Ibuprofen Rash    FAMILY HISTORY: Family History  Problem Relation Age of Onset  . Hypertension Maternal Grandmother     SOCIAL HISTORY: Social History   Socioeconomic History  . Marital status: Single    Spouse name: Not on file  . Number of children: Not on file  . Years of education: Not on file  . Highest education level: Not on file  Occupational History  . Not on file  Social Needs  . Financial resource strain: Not on file  . Food insecurity:    Worry: Not on file    Inability: Not on file  . Transportation needs:    Medical: Not on file    Non-medical: Not on file  Tobacco Use  . Smoking status: Never Smoker  . Smokeless tobacco: Never Used  Substance and Sexual Activity  . Alcohol use: Yes    Frequency: Never    Comment: occas  . Drug use: No  . Sexual activity: Yes    Birth control/protection: Pill  Lifestyle  . Physical activity:    Days per week: Not on file    Minutes per session: Not on file  . Stress: Not on file  Relationships  . Social connections:    Talks on phone: Not on file    Gets together: Not on file    Attends religious service: Not on file    Active member of club or organization: Not on file    Attends meetings of clubs or organizations: Not on file    Relationship status: Not on file  . Intimate partner violence:    Fear of current or ex partner: Not on file    Emotionally abused: Not on file    Physically  abused: Not on file    Forced sexual activity: Not on file  Other Topics Concern  . Not on file  Social History Narrative  . Not on file    REVIEW OF SYSTEMS: Constitutional: No fevers, chills, or sweats, no generalized fatigue, change in appetite Eyes: No visual changes, double vision, eye pain Ear, nose and throat: No hearing loss, ear pain, nasal congestion, sore throat Cardiovascular: No chest pain, palpitations Respiratory:  No shortness of breath at rest or with exertion, wheezes GastrointestinaI: No nausea, vomiting, diarrhea, abdominal pain, fecal incontinence Genitourinary:  No dysuria, urinary retention or frequency Musculoskeletal:  No neck pain, back pain Integumentary: No rash, pruritus, skin lesions Neurological: as above Psychiatric: anxiety Endocrine: No palpitations, fatigue, diaphoresis, mood swings, change in appetite, change in weight, increased thirst Hematologic/Lymphatic:  No purpura, petechiae. Allergic/Immunologic: no itchy/runny eyes, nasal congestion, recent allergic reactions, rashes  PHYSICAL EXAM: Vitals:   09/16/17 0821  BP:  108/68  Pulse: 77  SpO2: 97%   General: No acute distress.  Patient appears well-groomed.  Head:  Normocephalic/atraumatic Eyes:  fundi examined but not visualized Neck: supple, no paraspinal tenderness, full range of motion Back: No paraspinal tenderness Heart: regular rate and rhythm Lungs: Clear to auscultation bilaterally. Vascular: No carotid bruits. Neurological Exam: Mental status: alert and oriented to person, place, and time, recent and remote memory intact, fund of knowledge intact, attention and concentration intact, speech fluent and not dysarthric, language intact. Cranial nerves: CN I: not tested CN II: pupils equal, round and reactive to light, visual fields intact CN III, IV, VI:  full range of motion, no nystagmus, no ptosis CN V: facial sensation intact CN VII: upper and lower face symmetric CN VIII:  hearing intact CN IX, X: gag intact, uvula midline CN XI: sternocleidomastoid and trapezius muscles intact CN XII: tongue midline Bulk & Tone: normal, no fasciculations. Motor:  5/5 throughout  Sensation: temperature and vibration sensation intact. Deep Tendon Reflexes:  2+ throughout, toes downgoing.  Finger to nose testing:  Without dysmetria. Heel to shin:  Without dysmetria.   Gait:  Normal station and stride.  Able to turn and tandem walk. Romberg negative.  IMPRESSION: Migraine without aura, not intractable, without status migrainosus  PLAN: 1.  Restart Aimovig 140mg  monthly.  Last Aimovig 70mg  received on 7/15.  Continue gabapentin and topiramate for now. 2.  We will try a lower dose of rizatriptan to see if better tolerated and I will have her take with ibuprofen 800mg  to increase efficacy.  Contact me if ineffective 3.  Limit use of pain relievers to no more than 2 days out of the week.  These medications include acetaminophen, ibuprofen, triptans and narcotics.  This will help reduce risk of rebound headaches. 4.  Be aware of common food triggers such as processed sweets, processed foods with nitrites (such as deli meat, hot dogs, sausages), foods with MSG, alcohol (such as wine), chocolate, certain cheeses, certain fruits (dried fruits, bananas, some citrus fruit), vinegar, diet soda. 4.  Avoid caffeine 5.  Routine exercise 6.  Proper sleep hygiene 7.  Stay adequately hydrated with water 8.  Keep a headache diary. 9.  Maintain proper stress management. 10.  Do not skip meals. 11.  Consider supplements:  Magnesium citrate 400mg  to 600mg  daily, riboflavin 400mg , Coenzyme Q 10 100mg  three times daily 12.  Follow up in 3 to 4 months.  Thank you for allowing me to take part in the care of this patient.  Shon Millet, DO  CC: Marvia Pickles, MD

## 2017-09-16 NOTE — Patient Instructions (Signed)
Migraine Recommendations: 1.  Restart Aimovig 140mg  monthly.  Continue gabapentin and topiramate for now. 2.  Take rizatriptan 5mg  with ibuprofen 800mg  at earliest onset of headache.  May repeat rizatriptan once in 2 hours if needed.  Contact me if ineffective 3.  Limit use of pain relievers to no more than 2 days out of the week.  These medications include acetaminophen, ibuprofen, triptans and narcotics.  This will help reduce risk of rebound headaches. 4.  Be aware of common food triggers such as processed sweets, processed foods with nitrites (such as deli meat, hot dogs, sausages), foods with MSG, alcohol (such as wine), chocolate, certain cheeses, certain fruits (dried fruits, bananas, some citrus fruit), vinegar, diet soda. 4.  Avoid caffeine 5.  Routine exercise 6.  Proper sleep hygiene 7.  Stay adequately hydrated with water 8.  Keep a headache diary. 9.  Maintain proper stress management. 10.  Do not skip meals. 11.  Consider supplements:  Magnesium citrate 400mg  to 600mg  daily, riboflavin 400mg , Coenzyme Q 10 100mg  three times daily 12.  Follow up in 3 to 4 months.

## 2017-09-17 DIAGNOSIS — M329 Systemic lupus erythematosus, unspecified: Secondary | ICD-10-CM | POA: Diagnosis not present

## 2017-09-17 DIAGNOSIS — Z79899 Other long term (current) drug therapy: Secondary | ICD-10-CM | POA: Diagnosis not present

## 2017-09-17 DIAGNOSIS — M3214 Glomerular disease in systemic lupus erythematosus: Secondary | ICD-10-CM | POA: Diagnosis not present

## 2017-09-17 DIAGNOSIS — J984 Other disorders of lung: Secondary | ICD-10-CM | POA: Diagnosis not present

## 2017-09-23 DIAGNOSIS — M2142 Flat foot [pes planus] (acquired), left foot: Secondary | ICD-10-CM | POA: Diagnosis not present

## 2017-09-23 DIAGNOSIS — M79672 Pain in left foot: Secondary | ICD-10-CM | POA: Diagnosis not present

## 2017-10-14 ENCOUNTER — Ambulatory Visit: Payer: Medicaid Other

## 2017-10-14 ENCOUNTER — Encounter: Payer: Medicaid Other | Admitting: Podiatry

## 2017-10-14 DIAGNOSIS — I10 Essential (primary) hypertension: Secondary | ICD-10-CM | POA: Insufficient documentation

## 2017-10-14 DIAGNOSIS — N052 Unspecified nephritic syndrome with diffuse membranous glomerulonephritis: Secondary | ICD-10-CM | POA: Insufficient documentation

## 2017-10-14 DIAGNOSIS — Z9981 Dependence on supplemental oxygen: Secondary | ICD-10-CM | POA: Insufficient documentation

## 2017-10-14 DIAGNOSIS — G8929 Other chronic pain: Secondary | ICD-10-CM | POA: Insufficient documentation

## 2017-10-14 DIAGNOSIS — J449 Chronic obstructive pulmonary disease, unspecified: Secondary | ICD-10-CM | POA: Insufficient documentation

## 2017-10-21 NOTE — Progress Notes (Signed)
This encounter was created in error - please disregard.

## 2017-11-13 DIAGNOSIS — N052 Unspecified nephritic syndrome with diffuse membranous glomerulonephritis: Secondary | ICD-10-CM | POA: Diagnosis not present

## 2017-11-13 DIAGNOSIS — I10 Essential (primary) hypertension: Secondary | ICD-10-CM | POA: Diagnosis not present

## 2017-11-13 DIAGNOSIS — Z23 Encounter for immunization: Secondary | ICD-10-CM | POA: Diagnosis not present

## 2017-11-13 DIAGNOSIS — M329 Systemic lupus erythematosus, unspecified: Secondary | ICD-10-CM | POA: Diagnosis not present

## 2018-01-14 DIAGNOSIS — Z79899 Other long term (current) drug therapy: Secondary | ICD-10-CM | POA: Diagnosis not present

## 2018-01-14 DIAGNOSIS — M3219 Other organ or system involvement in systemic lupus erythematosus: Secondary | ICD-10-CM | POA: Diagnosis not present

## 2018-01-14 DIAGNOSIS — Z8659 Personal history of other mental and behavioral disorders: Secondary | ICD-10-CM | POA: Diagnosis not present

## 2018-01-22 ENCOUNTER — Encounter: Payer: Self-pay | Admitting: Family Medicine

## 2018-01-22 ENCOUNTER — Ambulatory Visit (INDEPENDENT_AMBULATORY_CARE_PROVIDER_SITE_OTHER): Payer: Medicaid Other | Admitting: Family Medicine

## 2018-01-22 VITALS — BP 126/84 | HR 114 | Temp 98.3°F | Ht 59.0 in | Wt 168.8 lb

## 2018-01-22 DIAGNOSIS — M329 Systemic lupus erythematosus, unspecified: Secondary | ICD-10-CM

## 2018-01-22 DIAGNOSIS — K219 Gastro-esophageal reflux disease without esophagitis: Secondary | ICD-10-CM | POA: Diagnosis not present

## 2018-01-22 DIAGNOSIS — J849 Interstitial pulmonary disease, unspecified: Secondary | ICD-10-CM | POA: Diagnosis not present

## 2018-01-22 DIAGNOSIS — I10 Essential (primary) hypertension: Secondary | ICD-10-CM

## 2018-01-22 DIAGNOSIS — N189 Chronic kidney disease, unspecified: Secondary | ICD-10-CM | POA: Insufficient documentation

## 2018-01-22 DIAGNOSIS — N182 Chronic kidney disease, stage 2 (mild): Secondary | ICD-10-CM

## 2018-01-22 DIAGNOSIS — F4323 Adjustment disorder with mixed anxiety and depressed mood: Secondary | ICD-10-CM

## 2018-01-22 DIAGNOSIS — T7491XA Unspecified adult maltreatment, confirmed, initial encounter: Secondary | ICD-10-CM | POA: Diagnosis not present

## 2018-01-22 DIAGNOSIS — G43709 Chronic migraine without aura, not intractable, without status migrainosus: Secondary | ICD-10-CM

## 2018-01-22 DIAGNOSIS — N052 Unspecified nephritic syndrome with diffuse membranous glomerulonephritis: Secondary | ICD-10-CM
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
## 2018-01-22 NOTE — Patient Instructions (Signed)
Preventive Care 18-39 Years, Female Preventive care refers to lifestyle choices and visits with your health care provider that can promote health and wellness. What does preventive care include?  A yearly physical exam. This is also called an annual well check.  Dental exams once or twice a year.  Routine eye exams. Ask your health care provider how often you should have your eyes checked.  Personal lifestyle choices, including: ? Daily care of your teeth and gums. ? Regular physical activity. ? Eating a healthy diet. ? Avoiding tobacco and drug use. ? Limiting alcohol use. ? Practicing safe sex. ? Taking vitamin and mineral supplements as recommended by your health care provider. What happens during an annual well check? The services and screenings done by your health care provider during your annual well check will depend on your age, overall health, lifestyle risk factors, and family history of disease. Counseling Your health care provider may ask you questions about your:  Alcohol use.  Tobacco use.  Drug use.  Emotional well-being.  Home and relationship well-being.  Sexual activity.  Eating habits.  Work and work Statistician.  Method of birth control.  Menstrual cycle.  Pregnancy history.  Screening You may have the following tests or measurements:  Height, weight, and BMI.  Diabetes screening. This is done by checking your blood sugar (glucose) after you have not eaten for a while (fasting).  Blood pressure.  Lipid and cholesterol levels. These may be checked every 5 years starting at age 66.  Skin check.  Hepatitis C blood test.  Hepatitis B blood test.  Sexually transmitted disease (STD) testing.  BRCA-related cancer screening. This may be done if you have a family history of breast, ovarian, tubal, or peritoneal cancers.  Pelvic exam and Pap test. This may be done every 3 years starting at age 40. Starting at age 59, this may be done every 5  years if you have a Pap test in combination with an HPV test.  Discuss your test results, treatment options, and if necessary, the need for more tests with your health care provider. Vaccines Your health care provider may recommend certain vaccines, such as:  Influenza vaccine. This is recommended every year.  Tetanus, diphtheria, and acellular pertussis (Tdap, Td) vaccine. You may need a Td booster every 10 years.  Varicella vaccine. You may need this if you have not been vaccinated.  HPV vaccine. If you are 69 or younger, you may need three doses over 6 months.  Measles, mumps, and rubella (MMR) vaccine. You may need at least one dose of MMR. You may also need a second dose.  Pneumococcal 13-valent conjugate (PCV13) vaccine. You may need this if you have certain conditions and were not previously vaccinated.  Pneumococcal polysaccharide (PPSV23) vaccine. You may need one or two doses if you smoke cigarettes or if you have certain conditions.  Meningococcal vaccine. One dose is recommended if you are age 27-21 years and a first-year college student living in a residence hall, or if you have one of several medical conditions. You may also need additional booster doses.  Hepatitis A vaccine. You may need this if you have certain conditions or if you travel or work in places where you may be exposed to hepatitis A.  Hepatitis B vaccine. You may need this if you have certain conditions or if you travel or work in places where you may be exposed to hepatitis B.  Haemophilus influenzae type b (Hib) vaccine. You may need this if  you have certain risk factors.  Talk to your health care provider about which screenings and vaccines you need and how often you need them. This information is not intended to replace advice given to you by your health care provider. Make sure you discuss any questions you have with your health care provider. Document Released: 03/26/2001 Document Revised: 10/18/2015  Document Reviewed: 11/29/2014 Elsevier Interactive Patient Education  [employer].

## 2018-01-23 DIAGNOSIS — F4323 Adjustment disorder with mixed anxiety and depressed mood: Secondary | ICD-10-CM | POA: Insufficient documentation

## 2018-01-23 DIAGNOSIS — G43909 Migraine, unspecified, not intractable, without status migrainosus: Secondary | ICD-10-CM | POA: Insufficient documentation

## 2018-01-23 DIAGNOSIS — T7491XA Unspecified adult maltreatment, confirmed, initial encounter: Secondary | ICD-10-CM | POA: Insufficient documentation

## 2018-01-23 NOTE — Assessment & Plan Note (Signed)
Followed by [facility] Continue losartan Reviewed recent labs 

## 2018-01-23 NOTE — Assessment & Plan Note (Signed)
[facility] Continue losartan 100 mg daily Continue to monitor Reviewed recent labs

## 2018-01-23 NOTE — Assessment & Plan Note (Signed)
Chronic and well-controlled Continue omeprazole Discussed risks of long-term PPI use

## 2018-01-23 NOTE — Assessment & Plan Note (Signed)
Chronic Secondary to SLE Continue Qvar as controller medication Followed by pulmonology Discussed return precautions

## 2018-01-23 NOTE — Assessment & Plan Note (Signed)
Chronic and stable Followed by rheumatology as well as other specialists for her complications Continue current medications Due to her taking chronic prednisone, if she is sick in the future, she may need stress dose steroids We will continue to monitor

## 2018-01-23 NOTE — Assessment & Plan Note (Signed)
Chronic, stable, well controlled Continue Aimovig per neurology Okay to use triptan as needed Neuro intact today without any deficits

## 2018-01-23 NOTE — Assessment & Plan Note (Signed)
Followed by [facility] Status post biopsy in 2012 Continue losartan Reviewed recent labs
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
## 2018-01-30 ENCOUNTER — Encounter: Payer: Self-pay | Admitting: Physician Assistant

## 2018-01-30 ENCOUNTER — Ambulatory Visit: Payer: Medicaid Other | Admitting: Physician Assistant

## 2018-01-30 VITALS — BP 128/86 | HR 80 | Temp 98.6°F | Resp 16 | Wt 164.0 lb

## 2018-01-30 DIAGNOSIS — Z113 Encounter for screening for infections with a predominantly sexual mode of transmission: Secondary | ICD-10-CM
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
## 2018-02-05 ENCOUNTER — Telehealth: Payer: Self-pay

## 2018-02-05 DIAGNOSIS — M3219 Other organ or system involvement in systemic lupus erythematosus: Secondary | ICD-10-CM | POA: Diagnosis not present

## 2018-02-05 DIAGNOSIS — Z79899 Other long term (current) drug therapy: Secondary | ICD-10-CM | POA: Diagnosis not present

## 2018-02-05 DIAGNOSIS — Z113 Encounter for screening for infections with a predominantly sexual mode of transmission: Secondary | ICD-10-CM | POA: Diagnosis not present

## 2018-02-05 LAB — NUSWAB VAGINITIS PLUS (VG+)
CANDIDA ALBICANS, NAA: NEGATIVE
CANDIDA GLABRATA, NAA: NEGATIVE
CHLAMYDIA TRACHOMATIS, NAA: NEGATIVE
Neisseria gonorrhoeae, NAA: NEGATIVE
TRICH VAG BY NAA: NEGATIVE

## 2018-02-05 LAB — HERPES SIMPLEX VIRUS CULTURE

## 2018-02-05 NOTE — Telephone Encounter (Signed)
-----   Message from Margaretann LovelessJennifer M Burnette, [place]PA-C sent at 02/05/2018 10:04 AM EST ----- All testing done was negative. It is recommended to recheck even if negative in 8 weeks again to make sure nothing develops. Call if any symptoms develop.

## 2018-02-05 NOTE — Telephone Encounter (Signed)
Patient was advised.  

## 2018-02-06 ENCOUNTER — Encounter: Payer: Self-pay | Admitting: Physician Assistant

## 2018-02-06 ENCOUNTER — Telehealth: Payer: Self-pay

## 2018-02-06 DIAGNOSIS — Z79899 Other long term (current) drug therapy: Secondary | ICD-10-CM | POA: Diagnosis not present

## 2018-02-06 DIAGNOSIS — M3219 Other organ or system involvement in systemic lupus erythematosus: Secondary | ICD-10-CM | POA: Diagnosis not present

## 2018-02-06 LAB — HIV ANTIBODY (ROUTINE TESTING W REFLEX): HIV SCREEN 4TH GENERATION: NONREACTIVE

## 2018-02-06 LAB — RPR: RPR: NONREACTIVE

## 2018-02-06 NOTE — Telephone Encounter (Signed)
LMTCB

## 2018-02-06 NOTE — Telephone Encounter (Signed)
-----   Message from Margaretann LovelessJennifer M Burnette, PA-C sent at 02/06/2018 10:11 AM EST ----- Labs normal.

## 2018-02-12 NOTE — Telephone Encounter (Signed)
Patient reports she has viewed results on Mychart.

## 2018-02-13 ENCOUNTER — Ambulatory Visit
Admission: EM | Admit: 2018-02-13 | Discharge: 2018-02-13 | Disposition: A | Discharge status: Home / Self Care | Payer: Medicaid Other | Attending: Family Medicine | Admitting: Family Medicine

## 2018-02-13 ENCOUNTER — Encounter: Payer: Self-pay | Admitting: Emergency Medicine

## 2018-02-13 ENCOUNTER — Other Ambulatory Visit: Payer: Self-pay

## 2018-02-13 DIAGNOSIS — G43909 Migraine, unspecified, not intractable, without status migrainosus: Secondary | ICD-10-CM | POA: Diagnosis not present

## 2018-02-13 DIAGNOSIS — Z9049 Acquired absence of other specified parts of digestive tract: Secondary | ICD-10-CM | POA: Diagnosis not present

## 2018-02-13 DIAGNOSIS — Z833 Family history of diabetes mellitus: Secondary | ICD-10-CM | POA: Insufficient documentation

## 2018-02-13 DIAGNOSIS — Z9981 Dependence on supplemental oxygen: Secondary | ICD-10-CM | POA: Insufficient documentation

## 2018-02-13 DIAGNOSIS — J449 Chronic obstructive pulmonary disease, unspecified: Secondary | ICD-10-CM | POA: Diagnosis not present

## 2018-02-13 DIAGNOSIS — Z886 Allergy status to analgesic agent status: Secondary | ICD-10-CM | POA: Diagnosis not present

## 2018-02-13 DIAGNOSIS — K219 Gastro-esophageal reflux disease without esophagitis: Secondary | ICD-10-CM | POA: Diagnosis not present

## 2018-02-13 DIAGNOSIS — Z8249 Family history of ischemic heart disease and other diseases of the circulatory system: Secondary | ICD-10-CM | POA: Diagnosis not present

## 2018-02-13 DIAGNOSIS — B373 Candidiasis of vulva and vagina: Secondary | ICD-10-CM | POA: Diagnosis not present

## 2018-02-13 DIAGNOSIS — N182 Chronic kidney disease, stage 2 (mild): Secondary | ICD-10-CM | POA: Diagnosis not present

## 2018-02-13 DIAGNOSIS — B3731 Acute candidiasis of vulva and vagina: Secondary | ICD-10-CM

## 2018-02-13 DIAGNOSIS — Z79899 Other long term (current) drug therapy: Secondary | ICD-10-CM | POA: Insufficient documentation

## 2018-02-13 DIAGNOSIS — M545 Low back pain, unspecified: Secondary | ICD-10-CM

## 2018-02-13 DIAGNOSIS — I129 Hypertensive chronic kidney disease with stage 1 through stage 4 chronic kidney disease, or unspecified chronic kidney disease: Secondary | ICD-10-CM | POA: Insufficient documentation

## 2018-02-13 DIAGNOSIS — Z7952 Long term (current) use of systemic steroids: Secondary | ICD-10-CM | POA: Insufficient documentation

## 2018-02-13 DIAGNOSIS — R109 Unspecified abdominal pain: Secondary | ICD-10-CM | POA: Diagnosis present

## 2018-02-13 LAB — WET PREP, GENITAL
CLUE CELLS WET PREP: NONE SEEN
SPERM: NONE SEEN
Trich, Wet Prep: NONE SEEN

## 2018-02-13 LAB — URINALYSIS, COMPLETE (UACMP) WITH MICROSCOPIC
Bilirubin Urine: NEGATIVE
GLUCOSE, UA: NEGATIVE mg/dL
Ketones, ur: NEGATIVE mg/dL
Leukocytes, UA: NEGATIVE
Nitrite: NEGATIVE
PH: 6 (ref 5.0–8.0)
Protein, ur: >300 mg/dL — AB
Specific Gravity, Urine: >1.03 — ABNORMAL HIGH (ref 1.005–1.030)

## 2018-02-13 MED ORDER — TRAMADOL HCL 50 MG PO TABS: 50.0000 mg | ORAL_TABLET | Freq: Three times a day (TID) | ORAL | 0 refills | Status: DC | PRN

## 2018-02-13 MED ORDER — FLUCONAZOLE 150 MG PO TABS: 150.0000 mg | ORAL_TABLET | Freq: Once | ORAL | 0 refills | Status: AC

## 2018-02-13 NOTE — ED Triage Notes (Addendum)
Patient in today c/o flank pain and vaginal discharge since yesterday. Patient denies fever, urinary frequency or dysuria. Patient denies fever. Patient has not tried any OTC medications. Patient denies concerns for STDs since she was tested last week. Patient does states that she feels dizzy.
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
## 2018-02-13 NOTE — Discharge Instructions (Signed)
Medication as prescribed.  Take care  Dr. Tamberlyn Midgley  

## 2018-02-16 DIAGNOSIS — F331 Major depressive disorder, recurrent, moderate: Secondary | ICD-10-CM | POA: Diagnosis not present

## 2018-02-17 ENCOUNTER — Encounter: Payer: Medicaid Other | Admitting: Obstetrics and Gynecology

## 2018-02-27 ENCOUNTER — Encounter: Payer: Self-pay | Admitting: Family Medicine

## 2018-02-27 ENCOUNTER — Other Ambulatory Visit: Payer: Self-pay

## 2018-02-27 MED ORDER — IBUPROFEN 800 MG PO TABS: 800.0000 mg | ORAL_TABLET | Freq: Three times a day (TID) | ORAL | 5 refills | Status: DC | PRN

## 2018-02-28 ENCOUNTER — Ambulatory Visit: Payer: Medicaid Other

## 2018-02-28 ENCOUNTER — Ambulatory Visit
Admission: EM | Admit: 2018-02-28 | Discharge: 2018-02-28 | Disposition: A | Discharge status: Home / Self Care | Payer: Medicaid Other | Attending: Family Medicine | Admitting: Family Medicine

## 2018-02-28 ENCOUNTER — Other Ambulatory Visit: Payer: Self-pay

## 2018-02-28 DIAGNOSIS — W228XXA Striking against or struck by other objects, initial encounter: Secondary | ICD-10-CM

## 2018-02-28 DIAGNOSIS — S92515A Nondisplaced fracture of proximal phalanx of left lesser toe(s), initial encounter for closed fracture: Secondary | ICD-10-CM | POA: Diagnosis not present

## 2018-02-28 DIAGNOSIS — S92512A Displaced fracture of proximal phalanx of left lesser toe(s), initial encounter for closed fracture: Secondary | ICD-10-CM | POA: Diagnosis not present
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
## 2018-02-28 NOTE — Discharge Instructions (Signed)
Buddy taping daily and hard soled shoe for 2-3 weeks Over the counter ibuprofen and/or tylenol as needed

## 2018-02-28 NOTE — ED Triage Notes (Signed)
Patient states that she hit her left foot on her foot board a week ago and is still having pain across top of her foot.

## 2018-03-02 DIAGNOSIS — F331 Major depressive disorder, recurrent, moderate: Secondary | ICD-10-CM | POA: Diagnosis not present

## 2018-03-04 ENCOUNTER — Encounter: Payer: Self-pay | Admitting: Family Medicine

## 2018-03-04 DIAGNOSIS — S92515A Nondisplaced fracture of proximal phalanx of left lesser toe(s), initial encounter for closed fracture: Secondary | ICD-10-CM | POA: Diagnosis not present

## 2018-03-13 ENCOUNTER — Encounter: Payer: Self-pay | Admitting: Family Medicine

## 2018-03-13 ENCOUNTER — Ambulatory Visit (INDEPENDENT_AMBULATORY_CARE_PROVIDER_SITE_OTHER): Payer: Medicaid Other | Admitting: Family Medicine

## 2018-03-13 VITALS — BP 128/90 | HR 80 | Temp 98.2°F | Wt 164.6 lb

## 2018-03-13 DIAGNOSIS — F4310 Post-traumatic stress disorder, unspecified: Secondary | ICD-10-CM | POA: Diagnosis not present

## 2018-03-13 DIAGNOSIS — F4323 Adjustment disorder with mixed anxiety and depressed mood: Secondary | ICD-10-CM

## 2018-03-13 MED ORDER — HYDROXYZINE HCL 10 MG PO TABS: 10.0000 mg | ORAL_TABLET | Freq: Three times a day (TID) | ORAL | 2 refills | Status: AC | PRN

## 2018-03-13 MED ORDER — CITALOPRAM HYDROBROMIDE 20 MG PO TABS: 20.0000 mg | ORAL_TABLET | Freq: Every day | ORAL | 3 refills | Status: DC
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
## 2018-03-13 NOTE — Patient Instructions (Signed)
Living With Post-Traumatic Stress Disorder  If you have been diagnosed with post-traumatic stress disorder (PTSD), you may be relieved that you now know why you have felt or behaved a certain way. Still, you may feel overwhelmed about the treatment ahead. You may also wonder how to get the support you need and how to deal with the condition day-to-day.  If you are living with PTSD, there are ways to help you recover from it and manage your symptoms.  How to manage lifestyle changes  Managing stress  Stress is your body's reaction to life changes and events, both good and bad. Stress can make PTSD worse. Take the following steps to cope with stress:   Talk with your health care provider or a counselor if you would like to learn more about techniques to reduce your stress. He or she may suggest some stress reduction techniques such as:  ? Muscle relaxation exercises.  ? Regular exercise.  ? Meditation, yoga, or other mind-body exercises.  ? Breathing exercises.  ? Listening to quiet music.  ? Spending time outside.   Maintain a healthy lifestyle. Eat a healthy diet, exercise regularly, get plenty of sleep, and take time to relax.   Spend time with others. Talk with them about how you are feeling and what kind of support you need. Try to not isolate yourself, even though you may feel like doing that. Isolating yourself can delay your recovery.   Do activities and hobbies that you enjoy.   Pace yourself when doing stressful things. Take breaks, and reward yourself when you finish. Make sure that you do not overload your schedule.    Medicines  Your health care provider may suggest certain medicines if he or she feels that they will help to improve your condition. Antidepressants or antipsychotic medicines may be used to treat PTSD. Avoid using alcohol and other substances that may prevent your medicines from working properly (may interact). It is also important to:   Talk with your pharmacist or health care  provider about all medicines that you take, their possible side effects, and which medicines are safe to take together.   Make it your goal to take part in all treatment decisions (shared decision-making). Ask about possible side effects of medicines that your health care provider recommends, and tell him or her how you feel about having those side effects. It is best if shared decision-making with your health care provider is part of your total treatment plan.  If your health care provider prescribes a medicine, you may not notice the full benefits of it for 4-8 weeks. Most people who are treated for PTSD need to take medicine for at least 6-12 months after they feel better. If you are taking medicines as part of your treatment, do not stop taking medicines before you ask your health care provider if it is safe to stop. You may need to have the medicine slowly decreased (tapered) over time to lower the risk of harmful side effects.  Relationships  Many people who have PTSD have difficulty trusting others. Make an effort to:   Take risks and develop trust with close friends and family members. Developing trust in others can help you feel safe and connect you with emotional support.   Be open and honest about your feelings.   Try to have fun and relax in safe spaces, such as with friends and family.   Think about going to couples counseling, family education classes, or family therapy. Your loved   ones may not always know how to be supportive. Therapy can be helpful for everyone.  How to recognize changes in your condition  Be aware of your symptoms and how often you have them. The following symptoms mean that you need to seek help for your PTSD:   You feel suspicious and angry.   You have repeated flashbacks.   You avoid going out or being with others.   You have an increasing number of fights with close friends or family members, such as your spouse.   You have thoughts about hurting yourself or  others.   You cannot get relief from feelings of depression or anxiety.  Where to find support  Talking to others   Explain that PTSD is a mental health problem. It is something that a person can develop after experiencing or seeing a life-threatening event. Tell them that PTSD makes you feel stress like you did during the event.   Talk to your loved ones about the symptoms you have. Also tell them what things or situations can cause symptoms to start (are triggers for you).   Assure your loved ones that there are treatments to help PTSD. Discuss possibly seeking family therapy or couples therapy.   If you are worried or fearful about seeking treatment, ask for support.  Finances  Not all insurance plans cover mental health care, so it is important to check with your insurance carrier. If paying for co-pays or counseling services is a problem, search for a local or county mental health care center. Public mental health care services may be offered there at a low cost or no cost when you are not able to see a private health care provider. If you are a veteran, contact a local veterans organization or veterans hospital for more information.  If you are taking medicine for PTSD, you may be able to get the genericform, which may be less expensive than brand-name medicine. Some makers of prescription medicines also offer help to patients who cannot afford the medicines that they need.  Community resources   Find a support group in your community. Often, groups are available for military veterans, trauma victims, and family members or caregivers.   Look into volunteer opportunities. Taking part in these can help you feel more connected to your community.   Contact a local organization to find out if you are eligible for a service dog.   Keep daily contact with at least one trusted friend or family member.  Follow these instructions at home:  Lifestyle   Exercise regularly. Try to do 30 or more minutes of  physical activity on most days of the week.   Try to get 7-9 hours of sleep each night. To help with sleep:  ? Keep your bedroom cool and dark.  ? Do not eat a heavy meal during the hour before you go to bed.  ? Do not drink alcohol or caffeinated drinks before bed.  ? Avoid screen time before bedtime. This means avoiding use of your TV, computer, tablet, and cell phone.   Avoid using alcohol or drugs.   Practice self-soothing skills and use them daily.   Try to have fun and seek humor in your life.  General instructions   If your PTSD is affecting your marriage or family, seek help from a family therapist.   Take over-the-counter and prescription medicines only as told by your health care provider.   Make sure to let all of your health care providers   know that you have PTSD. This is especially important if you are having surgery or need to be admitted to the hospital.   Keep all follow-up visits as told by your health care providers. This is important.  Where to find more information  Go to this website to find more information about PTSD, treatment for PTSD, and how to get support:   National Center for PTSD: www.ptsd.va.gov  Contact a health care provider if:   Your symptoms get worse or they do not get better.  Get help right away if:   You have thoughts about hurting yourself or others.  If you ever feel like you may hurt yourself or others, or have thoughts about taking your own life, get help right away. You can go to your nearest emergency department or call:   Your local emergency services (911 in the [place]   A suicide crisis helpline, such as the National Suicide Prevention Lifeline at 1-800-273-8255. This is open 24-hours a day.  Summary   If you are living with PTSD, there are ways to help you recover from it and manage your symptoms.   Find supportive environments and people who understand PTSD. Spend time in those places, and maintain contact with those people.   Work with your health  care team to create a management plan that includes counseling, stress management techniques, and healthy lifestyle habits.  This information is not intended to replace advice given to you by your health care provider. Make sure you discuss any questions you have with your health care provider.  Document Released: 05/30/2016 Document Revised: 05/30/2016 Document Reviewed: 05/30/2016  Elsevier Interactive Patient Education  2019 [employer]vier Inc.
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
## 2018-03-25 DIAGNOSIS — M3214 Glomerular disease in systemic lupus erythematosus: Secondary | ICD-10-CM | POA: Diagnosis not present

## 2018-03-27 DIAGNOSIS — F331 Major depressive disorder, recurrent, moderate: Secondary | ICD-10-CM | POA: Diagnosis not present

## 2018-04-01 DIAGNOSIS — F331 Major depressive disorder, recurrent, moderate: Secondary | ICD-10-CM | POA: Diagnosis not present

## 2018-04-15 ENCOUNTER — Ambulatory Visit
Admission: EM | Admit: 2018-04-15 | Discharge: 2018-04-15 | Disposition: A | Discharge status: Home / Self Care | Payer: Medicaid Other | Attending: Family Medicine | Admitting: Family Medicine

## 2018-04-15 ENCOUNTER — Encounter: Payer: Self-pay | Admitting: *Deleted

## 2018-04-15 DIAGNOSIS — K0889 Other specified disorders of teeth and supporting structures: Secondary | ICD-10-CM | POA: Diagnosis not present

## 2018-04-15 DIAGNOSIS — I10 Essential (primary) hypertension: Secondary | ICD-10-CM | POA: Diagnosis not present

## 2018-04-15 MED ORDER — HYDROCODONE-ACETAMINOPHEN 5-325 MG PO TABS: 1.0000 | ORAL_TABLET | Freq: Three times a day (TID) | ORAL | 0 refills | Status: DC | PRN

## 2018-04-15 MED ORDER — AMOXICILLIN-POT CLAVULANATE 875-125 MG PO TABS: 1.0000 | ORAL_TABLET | Freq: Two times a day (BID) | ORAL | 0 refills | Status: DC

## 2018-04-15 NOTE — ED Triage Notes (Addendum)
Patient reports left upper dental pain since Monday. Patient states about a month ago part of tooth brook off and on Monday she was chewing gum when tooth started hurting again.

## 2018-04-15 NOTE — Discharge Instructions (Signed)
Medications as prescribed.  Please see your dentist.  Take care  Dr. Adriana Simas
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
## 2018-04-17 DIAGNOSIS — F331 Major depressive disorder, recurrent, moderate: Secondary | ICD-10-CM | POA: Diagnosis not present

## 2018-04-22 ENCOUNTER — Ambulatory Visit: Payer: Medicaid Other | Admitting: Family Medicine

## 2018-04-22 NOTE — Progress Notes (Deleted)
Patient: Faith Camacho Female    DOB: [birth year]   27 y.o.   MRN: [id number] Visit Date: 04/22/2018  Today's Provider: Shirlee Latch, MD   No chief complaint on file.  Subjective:    HPI  Anxiety & Depression Patient presents today for 6 week follow-up. Patient last office visit was 03/13/2018. Patient is still going to therapy and doing well. Patient was started on Celexa 20 mg daily and Hydroxyzine 10 mg daily.      Allergies  Allergen Reactions  . Ibuprofen Rash     Current Outpatient Medications:  .  albuterol (PROVENTIL HFA;VENTOLIN HFA) 108 (90 Base) MCG/ACT inhaler, Inhale 2 puffs into the lungs every 4 (four) hours as needed for wheezing or shortness of breath., Disp: 1 Inhaler, Rfl: 0 .  amoxicillin-clavulanate (AUGMENTIN) 875-125 MG tablet, Take 1 tablet by mouth every 12 (twelve) hours., Disp: 14 tablet, Rfl: 0 .  beclomethasone (QVAR) 80 MCG/ACT inhaler, Inhale 2 puffs into the lungs 2 (two) times daily., Disp: , Rfl:  .  calcium citrate-vitamin D (CITRACAL+D) 315-200 MG-UNIT tablet, Take 1 tablet by mouth 2 (two) times daily., Disp: , Rfl:  .  citalopram (CELEXA) 20 MG tablet, Take 1 tablet (20 mg total) by mouth daily., Disp: 30 tablet, Rfl: 3 .  Erenumab-aooe (AIMOVIG) 140 MG/ML SOAJ, Inject 140 mg into the skin every 30 (thirty) days., Disp: 1 pen, Rfl: 11 .  gabapentin (NEURONTIN) 300 MG capsule, Take 300 mg by mouth 2 (two) times daily. , Disp: , Rfl:  .  HYDROcodone-acetaminophen (NORCO/VICODIN) 5-325 MG tablet, Take 1 tablet by mouth every 8 (eight) hours as needed for moderate pain or severe pain., Disp: 10 tablet, Rfl: 0 .  hydrOXYzine (ATARAX/VISTARIL) 10 MG tablet, Take 1 tablet (10 mg total) by mouth 3 (three) times daily as needed for anxiety., Disp: 30 tablet, Rfl: 2 .  ibuprofen (ADVIL,MOTRIN) 800 MG tablet, Take 1 tablet (800 mg total) by mouth every 8 (eight) hours as needed for cramping., Disp: 60 tablet, Rfl: 5 .  losartan (COZAAR) 100  MG tablet, Take 100 mg by mouth daily., Disp: , Rfl:  .  mycophenolate (CELLCEPT) 500 MG tablet, Take by mouth., Disp: , Rfl:  .  norethindrone-ethinyl estradiol-iron (JUNEL FE 1.5/30) 1.5-30 MG-MCG tablet, Take 1 tablet by mouth daily., Disp: 1 Package, Rfl: 11 .  omeprazole (PRILOSEC) 20 MG capsule, Take 20 mg by mouth daily., Disp: , Rfl:  .  potassium chloride (K-DUR) 10 MEQ tablet, Take 10 mEq by mouth daily., Disp: , Rfl:  .  predniSONE (DELTASONE) 5 MG tablet, Take 5 mg by mouth daily with breakfast., Disp: , Rfl:  .  rizatriptan (MAXALT) 5 MG tablet, Take 1 tablet earliest onset of migraine.  May repeat x1 in 2 hours if needed, Disp: 10 tablet, Rfl: 3 .  sodium bicarbonate 325 MG tablet, Take 325 mg by mouth., Disp: , Rfl:  .  traMADol (ULTRAM) 50 MG tablet, Take 1 tablet (50 mg total) by mouth every 8 (eight) hours as needed., Disp: 15 tablet, Rfl: 0  Review of Systems  Constitutional: Negative.   Neurological: Negative.   Psychiatric/Behavioral: Negative.     Social History   Tobacco Use  . Smoking status: Never Smoker  . Smokeless tobacco: Never Used  Substance Use Topics  . Alcohol use: Yes    Frequency: Never    Comment: occas      Objective:   There were no vitals taken for this visit.  There were no vitals filed for this visit.   Physical Exam      Assessment & Plan        Shirlee Latch, MD  [facility]

## 2018-04-25 ENCOUNTER — Other Ambulatory Visit: Payer: Self-pay | Admitting: Family Medicine

## 2018-05-04 ENCOUNTER — Encounter: Payer: Self-pay | Admitting: Family Medicine

## 2018-05-04 ENCOUNTER — Ambulatory Visit (INDEPENDENT_AMBULATORY_CARE_PROVIDER_SITE_OTHER): Payer: Medicaid Other | Admitting: Family Medicine

## 2018-05-04 ENCOUNTER — Other Ambulatory Visit: Payer: Self-pay

## 2018-05-04 VITALS — BP 119/77 | HR 77 | Temp 98.0°F | Wt 164.8 lb

## 2018-05-04 DIAGNOSIS — F4323 Adjustment disorder with mixed anxiety and depressed mood: Secondary | ICD-10-CM | POA: Diagnosis not present

## 2018-05-04 DIAGNOSIS — F4310 Post-traumatic stress disorder, unspecified: Secondary | ICD-10-CM | POA: Diagnosis not present
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
## 2018-05-04 NOTE — Assessment & Plan Note (Addendum)
Significantly improved Doing well with therapy Continue celexa - take at bedtime to minimize fatigue Discussed possible side effects

## 2018-05-06 DIAGNOSIS — M3214 Glomerular disease in systemic lupus erythematosus: Secondary | ICD-10-CM | POA: Diagnosis not present

## 2018-05-13 DIAGNOSIS — F331 Major depressive disorder, recurrent, moderate: Secondary | ICD-10-CM | POA: Diagnosis not present

## 2018-05-21 DIAGNOSIS — M329 Systemic lupus erythematosus, unspecified: Secondary | ICD-10-CM | POA: Diagnosis not present

## 2018-05-21 DIAGNOSIS — N182 Chronic kidney disease, stage 2 (mild): Secondary | ICD-10-CM | POA: Diagnosis not present

## 2018-05-25 ENCOUNTER — Encounter: Payer: Self-pay | Admitting: Family Medicine

## 2018-05-25 ENCOUNTER — Other Ambulatory Visit: Payer: Self-pay | Admitting: Obstetrics and Gynecology

## 2018-05-27 DIAGNOSIS — F331 Major depressive disorder, recurrent, moderate: Secondary | ICD-10-CM | POA: Diagnosis not present

## 2018-05-27 MED ORDER — IBUPROFEN 800 MG PO TABS: 800.0000 mg | ORAL_TABLET | Freq: Three times a day (TID) | ORAL | 5 refills | Status: DC | PRN

## 2018-05-29 ENCOUNTER — Telehealth: Payer: Self-pay

## 2018-05-29 ENCOUNTER — Encounter: Payer: Self-pay | Admitting: Family Medicine

## 2018-05-29 ENCOUNTER — Other Ambulatory Visit: Payer: Self-pay | Admitting: Family Medicine

## 2018-05-29 ENCOUNTER — Ambulatory Visit (INDEPENDENT_AMBULATORY_CARE_PROVIDER_SITE_OTHER): Payer: Medicaid Other | Admitting: Family Medicine

## 2018-05-29 DIAGNOSIS — K047 Periapical abscess without sinus: Secondary | ICD-10-CM | POA: Diagnosis not present

## 2018-05-29 DIAGNOSIS — K0889 Other specified disorders of teeth and supporting structures: Secondary | ICD-10-CM | POA: Diagnosis not present

## 2018-05-29 MED ORDER — AMOXICILLIN-POT CLAVULANATE 875-125 MG PO TABS: 1.0000 | ORAL_TABLET | Freq: Two times a day (BID) | ORAL | 0 refills | Status: DC

## 2018-05-29 NOTE — Telephone Encounter (Signed)
Patient called office with complaint of toothache for the past 24hrs. Patient states that she believes that she has a infection in her tooth and might have to get tooth pulled. Patient is asking for a prescription for Augmentin sent to [facility] [address] [facility]

## 2018-05-29 NOTE — Progress Notes (Signed)
Patient: Faith Camacho Female    DOB: 1990/12/28   28 y.o.   MRN: [id number] Visit Date: 05/29/2018  Today's Provider: Shirlee Latch, MD   Chief Complaint  Patient presents with  . Dental Pain   Subjective:    Virtual Visit via Video Note  I connected with Faith Camacho on 05/29/18 at  3:20 PM EDT by a video enabled telemedicine application and verified that I am speaking with the correct person using two identifiers.   I discussed the limitations of evaluation and management by telemedicine and the availability of in person appointments. The patient expressed understanding and agreed to proceed.   Patient location: home Provider location: home office Persons involved in the visit: patient, provider    Dental Pain   This is a recurrent problem. The current episode started yesterday. The problem occurs constantly. The problem has been gradually worsening. The pain is moderate. Associated symptoms include facial pain. She has tried nothing for the symptoms.   Patient was supposed to see the dentist last month, but this was canceled due to pandemic.  She was referred there after being seen in UC for dental abscess. She was treated with Augmentin and as it had gotten better, she was ok with cancelling the dentist appt.  It has now recurred.  There is a lot of swelling and tenderness over her gums surrounding the tooth.  No purulent drainage or fever.  Able to toelrate PO.  She has appt with dentist scheduled for next week.  Allergies  Allergen Reactions  . Ibuprofen Rash     Current Outpatient Medications:  .  albuterol (PROVENTIL HFA;VENTOLIN HFA) 108 (90 Base) MCG/ACT inhaler, Inhale 2 puffs into the lungs every 4 (four) hours as needed for wheezing or shortness of breath., Disp: 1 Inhaler, Rfl: 0 .  beclomethasone (QVAR) 80 MCG/ACT inhaler, Inhale 2 puffs into the lungs 2 (two) times daily., Disp: , Rfl:  .  calcium citrate-vitamin D (CITRACAL+D) 315-200 MG-UNIT tablet,  Take 1 tablet by mouth 2 (two) times daily., Disp: , Rfl:  .  citalopram (CELEXA) 20 MG tablet, Take 1 tablet (20 mg total) by mouth daily., Disp: 30 tablet, Rfl: 3 .  Erenumab-aooe (AIMOVIG) 140 MG/ML SOAJ, Inject 140 mg into the skin every 30 (thirty) days., Disp: 1 pen, Rfl: 11 .  gabapentin (NEURONTIN) 300 MG capsule, Take 300 mg by mouth 2 (two) times daily. , Disp: , Rfl:  .  HYDROcodone-acetaminophen (NORCO/VICODIN) 5-325 MG tablet, Take 1 tablet by mouth every 8 (eight) hours as needed for moderate pain or severe pain., Disp: 10 tablet, Rfl: 0 .  hydroxychloroquine (PLAQUENIL) 200 MG tablet, Take by mouth., Disp: , Rfl:  .  hydrOXYzine (ATARAX/VISTARIL) 10 MG tablet, Take 1 tablet (10 mg total) by mouth 3 (three) times daily as needed for anxiety., Disp: 30 tablet, Rfl: 2 .  ibuprofen (ADVIL,MOTRIN) 800 MG tablet, Take 1 tablet (800 mg total) by mouth every 8 (eight) hours as needed for cramping., Disp: 60 tablet, Rfl: 5 .  losartan (COZAAR) 100 MG tablet, Take 100 mg by mouth daily., Disp: , Rfl:  .  meloxicam (MOBIC) 15 MG tablet, Take by mouth., Disp: , Rfl:  .  mycophenolate (CELLCEPT) 500 MG tablet, Take by mouth., Disp: , Rfl:  .  norethindrone-ethinyl estradiol-iron (JUNEL FE 1.5/30) 1.5-30 MG-MCG tablet, Take 1 tablet by mouth daily., Disp: 1 Package, Rfl: 11 .  omeprazole (PRILOSEC) 20 MG capsule, Take 20 mg by mouth daily., Disp: ,  Rfl:  .  potassium chloride (K-DUR) 10 MEQ tablet, Take 10 mEq by mouth daily., Disp: , Rfl:  .  predniSONE (DELTASONE) 5 MG tablet, Take 5 mg by mouth daily with breakfast., Disp: , Rfl:  .  rizatriptan (MAXALT) 5 MG tablet, Take 1 tablet earliest onset of migraine.  May repeat x1 in 2 hours if needed, Disp: 10 tablet, Rfl: 3 .  sodium bicarbonate 325 MG tablet, Take 325 mg by mouth., Disp: , Rfl:  .  traMADol (ULTRAM) 50 MG tablet, Take 1 tablet (50 mg total) by mouth every 8 (eight) hours as needed., Disp: 15 tablet, Rfl: 0 .  amoxicillin-clavulanate  (AUGMENTIN) 875-125 MG tablet, Take 1 tablet by mouth every 12 (twelve) hours., Disp: 14 tablet, Rfl: 0  Review of Systems  Constitutional: Negative.   Respiratory: Negative.   Cardiovascular: Negative.   Psychiatric/Behavioral: Negative.     Social History   Tobacco Use  . Smoking status: Never Smoker  . Smokeless tobacco: Never Used  Substance Use Topics  . Alcohol use: Yes    Frequency: Never    Comment: occas      Objective:   There were no vitals taken for this visit. There were no vitals filed for this visit.   Physical Exam Constitutional:      Appearance: Normal appearance.  Pulmonary:     Effort: Pulmonary effort is normal. No respiratory distress.  Psychiatric:        Mood and Affect: Mood normal.        Behavior: Behavior normal.         Assessment & Plan    I discussed the assessment and treatment plan with the patient. The patient was provided an opportunity to ask questions and all were answered. The patient agreed with the plan and demonstrated an understanding of the instructions.   The patient was advised to call back or seek an in-person evaluation if the symptoms worsen or if the condition fails to improve as anticipated.    1. Pain, dental 2. Dental infection - unable to visualize in mouth well on video, but symptoms are concerning for possible dental abscess or carie - discussed oral hygeine and importance of f/u with dentist even if pain improves with abx - will start empiric treatment with Augmentin x7d (was sent in refill encounter that was created earlier), which may be changed/discontinued after dental eval - discussed strict return precautions    Return if symptoms worsen or fail to improve.   The entirety of the information documented in the History of Present Illness, Review of Systems and Physical Exam were personally obtained by me. Portions of this information were initially documented by Faith Camacho, CMA and reviewed by me  for thoroughness and accuracy.    Erasmo DownerBacigalupo, Peighton Mehra M, MD, MPH [facility] 05/29/2018 3:20 PM

## 2018-05-29 NOTE — Patient Instructions (Signed)
Dental Abscess    A dental abscess is a collection of pus in or around a tooth that results from an infection. An abscess can cause pain in the affected area as well as other symptoms. Treatment is important to help with symptoms and to prevent the infection from spreading.  What are the causes?  This condition is caused by a bacterial infection around the root of the tooth that involves the inner part of the tooth (pulp). It may result from:   Severe tooth decay.   Trauma to the tooth, such as a broken or chipped tooth, that allows bacteria to enter into the pulp.   Severe gum disease around a tooth.  What increases the risk?  This condition is more likely to develop in males. It is also more likely to develop in people who:   Have dental decay (cavities).   Eat sugary snacks between meals.   Use tobacco products.   Have diabetes.   Have a weakened disease-fighting system (immune system).   Do not brush and care for their teeth regularly.  What are the signs or symptoms?  Symptoms of this condition include:   Severe pain in and around the infected tooth.   Swelling and redness around the infected tooth, in the mouth, or in the face.   Tenderness.   Pus drainage.   Bad breath.   Bitter taste in the mouth.   Difficulty swallowing.   Difficulty opening the mouth.   Nausea.   Vomiting.   Chills.   Swollen neck glands.   Fever.  How is this diagnosed?  This condition is diagnosed based on:   Your symptoms and your medical and dental history.   An examination of the infected tooth. During the exam, your dentist may tap on the infected tooth.  You may also have X-rays of the affected area.  How is this treated?  This condition is treated by getting rid of the infection. This may be done with:   Incision and drainage. This procedure is done by making an incision in the abscess to drain out the pus. Removing pus is the first priority in treating an abscess.   Antibiotic medicines. These may be used  in certain situations.   Antibacterial mouth rinse.   A root canal. This may be performed to save the tooth. Your dentist accesses the visible part of your tooth (crown) with a drill and removes any damaged pulp. Then the space is filled and sealed off.   Tooth extraction. The tooth is pulled out if it cannot be saved by other treatment.  You may also receive treatment for pain, such as:   Acetaminophen or NSAIDs.   Gels that contain a numbing medicine.   An injection to block the pain near your nerve.  Follow these instructions at home:  Medicines   Take over-the-counter and prescription medicines only as told by your dentist.   If you were prescribed an antibiotic, take it as told by your dentist. Do not stop taking the antibiotic even if you start to feel better.   If you were prescribed a gel that contains a numbing medicine, use it exactly as told in the directions. Do not use these gels for children who are younger than 2 years of age.   Do not drive or use heavy machinery while taking prescription pain medicine.  General instructions   Rinse out your mouth often with salt water to relieve pain or swelling. To make a salt-water   mixture, completely dissolve -1 tsp of salt in 1 cup of warm water.   Eat a soft diet while your abscess is healing.   Drink enough fluid to keep your urine pale yellow.   Do not apply heat to the outside of your mouth.   Do not use any products that contain nicotine or tobacco, such as cigarettes and e-cigarettes. If you need help quitting, ask your health care provider.   Keep all follow-up visits as told by your dentist. This is important.  How is this prevented?   Brush your teeth every morning and night with fluoride toothpaste. Floss one time each day.   Get regularly scheduled dental cleanings.   Consider having a dental sealant applied on teeth that have deep holes (caries).   Drink fluoridated water regularly. This includes most tap water. Check the label  on bottled water to see if it contains fluoride.   Drink water instead of sugary drinks.   Eat healthy meals and snacks.   Wear a mouth guard or face shield to protect your teeth while playing sports.  Contact a health care provider if:   Your pain is worse and is not helped by medicine.  Get help right away if:   You have a fever or chills.   Your symptoms suddenly get worse.   You have a very bad headache.   You have problems breathing or swallowing.   You have trouble opening your mouth.   You have swelling in your neck or around your eye.  Summary   A dental abscess is a collection of pus in or around a tooth that results from an infection.   A dental abscess may result from severe tooth decay, trauma to the tooth, or severe gum disease around a tooth.   Symptoms include severe pain, swelling, redness, and drainage of pus in and around the infected tooth.   The first priority in treating a dental abscess is to drain out the pus. Treatment may also involve removing damage inside the tooth (root canal) or pulling out (extracting) the tooth.  This information is not intended to replace advice given to you by your health care provider. Make sure you discuss any questions you have with your health care provider.  Document Released: 01/28/2005 Document Revised: 09/30/2016 Document Reviewed: 09/30/2016  Elsevier Interactive Patient Education  2019 [employer]

## 2018-05-29 NOTE — Telephone Encounter (Signed)
Patient to be scheduled for evisit

## 2018-06-05 ENCOUNTER — Other Ambulatory Visit: Payer: Self-pay

## 2018-06-05 ENCOUNTER — Ambulatory Visit
Admission: EM | Admit: 2018-06-05 | Discharge: 2018-06-05 | Disposition: A | Discharge status: Home / Self Care | Payer: Medicaid Other | Attending: Family Medicine | Admitting: Family Medicine

## 2018-06-05 DIAGNOSIS — K0889 Other specified disorders of teeth and supporting structures: Secondary | ICD-10-CM

## 2018-06-05 MED ORDER — CLINDAMYCIN HCL 150 MG PO CAPS: 450.0000 mg | ORAL_CAPSULE | Freq: Three times a day (TID) | ORAL | 0 refills | Status: AC

## 2018-06-05 MED ORDER — HYDROCODONE-ACETAMINOPHEN 5-325 MG PO TABS: 1.0000 | ORAL_TABLET | Freq: Three times a day (TID) | ORAL | 0 refills | Status: DC | PRN

## 2018-06-05 NOTE — Discharge Instructions (Signed)
Medications as prescribed.  Call dentist first thing Monday morning.  Dr. Adriana Simas
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
## 2018-06-05 NOTE — ED Triage Notes (Signed)
Patient complains of dental pain that occurred after an extraction on Tuesday, states that she did not call her surgeon. Patient states that area is painful.

## 2018-06-12 DIAGNOSIS — F331 Major depressive disorder, recurrent, moderate: Secondary | ICD-10-CM | POA: Diagnosis not present

## 2018-07-08 DIAGNOSIS — F331 Major depressive disorder, recurrent, moderate: Secondary | ICD-10-CM | POA: Diagnosis not present

## 2018-07-11 ENCOUNTER — Other Ambulatory Visit: Payer: Self-pay

## 2018-07-11 ENCOUNTER — Emergency Department
Admission: EM | Admit: 2018-07-11 | Discharge: 2018-07-11 | Discharge status: Against Medical Advice | Payer: Medicaid Other | Attending: Emergency Medicine | Admitting: Emergency Medicine

## 2018-07-11 DIAGNOSIS — Z8739 Personal history of other diseases of the musculoskeletal system and connective tissue: Secondary | ICD-10-CM | POA: Insufficient documentation

## 2018-07-11 DIAGNOSIS — M791 Myalgia, unspecified site: Secondary | ICD-10-CM | POA: Diagnosis not present

## 2018-07-11 DIAGNOSIS — Z5321 Procedure and treatment not carried out due to patient leaving prior to being seen by health care provider: Secondary | ICD-10-CM | POA: Insufficient documentation

## 2018-07-11 LAB — COMPREHENSIVE METABOLIC PANEL
ALT: 10 U/L (ref 0–44)
AST: 17 U/L (ref 15–41)
Albumin: 2.8 g/dL — ABNORMAL LOW (ref 3.5–5.0)
Alkaline Phosphatase: 51 U/L (ref 38–126)
Anion gap: 8 (ref 5–15)
BUN: 13 mg/dL (ref 6–20)
CO2: 18 mmol/L — ABNORMAL LOW (ref 22–32)
Calcium: 7.8 mg/dL — ABNORMAL LOW (ref 8.9–10.3)
Chloride: 112 mmol/L — ABNORMAL HIGH (ref 98–111)
Creatinine, Ser: 0.65 mg/dL (ref 0.44–1.00)
GFR calc Af Amer: >60 mL/min (ref >60)
GFR calc non Af Amer: >60 mL/min (ref >60)
Glucose, Bld: 110 mg/dL — ABNORMAL HIGH (ref 70–99)
Potassium: 4.1 mmol/L (ref 3.5–5.1)
Sodium: 138 mmol/L (ref 135–145)
Total Bilirubin: 0.6 mg/dL (ref 0.3–1.2)
Total Protein: 5.6 g/dL — ABNORMAL LOW (ref 6.5–8.1)

## 2018-07-11 LAB — CBC WITH DIFFERENTIAL/PLATELET
Abs Immature Granulocytes: 0.01 10*3/uL (ref 0.00–0.07)
Basophils Absolute: 0 10*3/uL (ref 0.0–0.1)
Basophils Relative: 0 %
Eosinophils Absolute: 0 10*3/uL (ref 0.0–0.5)
Eosinophils Relative: 1 %
HCT: 40.6 % (ref 36.0–46.0)
Hemoglobin: 13.4 g/dL (ref 12.0–15.0)
Immature Granulocytes: 0 %
Lymphocytes Relative: 20 %
Lymphs Abs: 0.8 10*3/uL (ref 0.7–4.0)
MCH: 30.9 pg (ref 26.0–34.0)
MCHC: 33 g/dL (ref 30.0–36.0)
MCV: 93.8 fL (ref 80.0–100.0)
Monocytes Absolute: 0.3 10*3/uL (ref 0.1–1.0)
Monocytes Relative: 7 %
Neutro Abs: 2.8 10*3/uL (ref 1.7–7.7)
Neutrophils Relative %: 72 %
Platelets: 273 10*3/uL (ref 150–400)
RBC: 4.33 MIL/uL (ref 3.87–5.11)
RDW: 14.1 % (ref 11.5–15.5)
WBC: 3.9 10*3/uL — ABNORMAL LOW (ref 4.0–10.5)
nRBC: 0 % (ref 0.0–0.2)

## 2018-07-11 NOTE — ED Triage Notes (Signed)
Patient states she is having a lupus flare.

## 2018-07-11 NOTE — ED Notes (Signed)
Pt reports hx of lupus and having pain over most of her body.

## 2018-07-13 ENCOUNTER — Telehealth: Payer: Self-pay | Admitting: Emergency Medicine

## 2018-07-13 NOTE — Telephone Encounter (Signed)
Called patient due to lwot to inquire about condition and follow up plans. She has appt with her doctor on Wednesday, but I asked her to call today and have them review her lab results from here.

## 2018-07-15 DIAGNOSIS — M329 Systemic lupus erythematosus, unspecified: Secondary | ICD-10-CM | POA: Diagnosis not present

## 2018-07-15 DIAGNOSIS — M2550 Pain in unspecified joint: Secondary | ICD-10-CM | POA: Diagnosis not present

## 2018-07-15 DIAGNOSIS — Z79899 Other long term (current) drug therapy: Secondary | ICD-10-CM | POA: Diagnosis not present

## 2018-07-15 DIAGNOSIS — M3214 Glomerular disease in systemic lupus erythematosus: Secondary | ICD-10-CM | POA: Diagnosis not present

## 2018-07-21 DIAGNOSIS — B360 Pityriasis versicolor: Secondary | ICD-10-CM | POA: Diagnosis not present

## 2018-07-27 DIAGNOSIS — F331 Major depressive disorder, recurrent, moderate: Secondary | ICD-10-CM | POA: Diagnosis not present

## 2018-08-04 ENCOUNTER — Encounter: Payer: Self-pay | Admitting: Family Medicine

## 2018-08-04 DIAGNOSIS — Z9981 Dependence on supplemental oxygen: Secondary | ICD-10-CM | POA: Diagnosis not present

## 2018-08-04 DIAGNOSIS — J984 Other disorders of lung: Secondary | ICD-10-CM | POA: Diagnosis not present

## 2018-08-04 DIAGNOSIS — J849 Interstitial pulmonary disease, unspecified: Secondary | ICD-10-CM | POA: Diagnosis not present

## 2018-08-04 DIAGNOSIS — R0602 Shortness of breath: Secondary | ICD-10-CM | POA: Diagnosis not present

## 2018-08-05 MED ORDER — CITALOPRAM HYDROBROMIDE 20 MG PO TABS: 20.0000 mg | ORAL_TABLET | Freq: Every day | ORAL | 3 refills | Status: DC

## 2018-08-07 ENCOUNTER — Encounter: Payer: Self-pay | Admitting: Physician Assistant

## 2018-08-07 ENCOUNTER — Encounter: Payer: Self-pay | Admitting: Family Medicine

## 2018-08-07 ENCOUNTER — Ambulatory Visit (INDEPENDENT_AMBULATORY_CARE_PROVIDER_SITE_OTHER): Payer: Medicaid Other | Admitting: Physician Assistant

## 2018-08-07 VITALS — Ht 59.0 in | Wt 163.0 lb

## 2018-08-07 DIAGNOSIS — R42 Dizziness and giddiness: Secondary | ICD-10-CM

## 2018-08-07 DIAGNOSIS — R110 Nausea: Secondary | ICD-10-CM

## 2018-08-07 MED ORDER — ONDANSETRON HCL 4 MG PO TABS: 4.0000 mg | ORAL_TABLET | Freq: Three times a day (TID) | ORAL | 0 refills | Status: DC | PRN

## 2018-08-07 MED ORDER — MECLIZINE HCL 12.5 MG PO TABS: 12.5000 mg | ORAL_TABLET | Freq: Three times a day (TID) | ORAL | 0 refills | Status: DC | PRN

## 2018-08-07 NOTE — Telephone Encounter (Signed)
She would need further evaluation. Schedule for visit. Not enough info.

## 2018-08-07 NOTE — Telephone Encounter (Signed)
Put her on my 3:40. Can be virtual.

## 2018-08-07 NOTE — Patient Instructions (Signed)

## 2018-08-07 NOTE — Progress Notes (Signed)
Patient: Faith Camacho Female    DOB: 1990-03-16   28 y.o.   MRN: [id number] Visit Date: 08/07/2018  Today's Provider: Trinna Post, PA-C   Chief Complaint  Patient presents with  . Dizziness   Subjective:    Virtual Visit via Telephone Note  I connected with Faith Camacho on 08/11/18 at  3:40 PM EDT by telephone and verified that I am speaking with the correct person using two identifiers.   I discussed the limitations, risks, security and privacy concerns of performing an evaluation and management service by telephone and the availability of in person appointments. I also discussed with the patient that there may be a patient responsible charge related to this service. The patient expressed understanding and agreed to proceed.  Patient location: home Provider location: [place] office  Persons involved in the visit: patient, provider   HPI   Patient with history of SLE c/o nausea and dizziness since yesterday. Patient reports taking dramamine yesterday, reports some symptom control. Patient reports she is feeling a little better. Patient denies any vomiting, diarrhea, or URI symptoms.   Felt dizzy after coming from lunch at work at [employer] like she was unbalanced. Gets worse when standing up, did not vomit. Has shots for migraines but this does not feel like a migraine. She also has a history of asthma but is not short of breath, no cough, no wheezing, no fevers or chills. No new medications. Prednisone taper in June and she has been doing this steadily without missing a dose. Started at 40 mg daily and is currently at 20 mg daily. LMP was beginning of May. She has had unprotected sex. Not on birth control. Does have a history of irregular periods.  Allergies  Allergen Reactions  . Ibuprofen Rash     Current Outpatient Medications:  .  albuterol (PROVENTIL HFA;VENTOLIN HFA) 108 (90 Base) MCG/ACT inhaler, Inhale 2 puffs into the lungs  every 4 (four) hours as needed for wheezing or shortness of breath., Disp: 1 Inhaler, Rfl: 0 .  beclomethasone (QVAR) 80 MCG/ACT inhaler, Inhale 2 puffs into the lungs 2 (two) times daily., Disp: , Rfl:  .  calcium citrate-vitamin D (CITRACAL+D) 315-200 MG-UNIT tablet, Take 1 tablet by mouth 2 (two) times daily., Disp: , Rfl:  .  citalopram (CELEXA) 20 MG tablet, Take 1 tablet (20 mg total) by mouth daily., Disp: 30 tablet, Rfl: 3 .  Erenumab-aooe (AIMOVIG) 140 MG/ML SOAJ, Inject 140 mg into the skin every 30 (thirty) days., Disp: 1 pen, Rfl: 11 .  gabapentin (NEURONTIN) 300 MG capsule, Take 300 mg by mouth 2 (two) times daily. , Disp: , Rfl:  .  HYDROcodone-acetaminophen (NORCO/VICODIN) 5-325 MG tablet, Take 1 tablet by mouth every 8 (eight) hours as needed., Disp: 10 tablet, Rfl: 0 .  hydroxychloroquine (PLAQUENIL) 200 MG tablet, Take by mouth., Disp: , Rfl:  .  hydrOXYzine (ATARAX/VISTARIL) 10 MG tablet, Take 1 tablet (10 mg total) by mouth 3 (three) times daily as needed for anxiety., Disp: 30 tablet, Rfl: 2 .  losartan (COZAAR) 100 MG tablet, Take 100 mg by mouth daily., Disp: , Rfl:  .  meloxicam (MOBIC) 15 MG tablet, Take by mouth., Disp: , Rfl:  .  omeprazole (PRILOSEC) 20 MG capsule, Take 20 mg by mouth daily., Disp: , Rfl:  .  potassium chloride (K-DUR) 10 MEQ tablet, Take 10 mEq by mouth daily., Disp: , Rfl:  .  predniSONE (DELTASONE) 5 MG tablet,  Take 5 mg by mouth daily with breakfast., Disp: , Rfl:  .  rizatriptan (MAXALT) 5 MG tablet, Take 1 tablet earliest onset of migraine.  May repeat x1 in 2 hours if needed, Disp: 10 tablet, Rfl: 3 .  sodium bicarbonate 325 MG tablet, Take 325 mg by mouth., Disp: , Rfl:  .  traMADol (ULTRAM) 50 MG tablet, Take 1 tablet (50 mg total) by mouth every 8 (eight) hours as needed., Disp: 15 tablet, Rfl: 0  Review of Systems  Gastrointestinal: Positive for nausea.  Neurological: Positive for dizziness.    Social History   Tobacco Use  . Smoking  status: Never Smoker  . Smokeless tobacco: Never Used  Substance Use Topics  . Alcohol use: Yes    Frequency: Never    Comment: occas      Objective:   There were no vitals taken for this visit. There were no vitals filed for this visit.   Physical Exam   No results found for any visits on 08/07/18.     Assessment & Plan    1. Dizziness  Seems consistent with vertigo, she does not have any red flag symptoms. It is concerning for pregnancy that she has missed her period. Recommend she take a pregnancy test before she start any medication.   - meclizine (ANTIVERT) 12.5 MG tablet; Take 1 tablet (12.5 mg total) by mouth 3 (three) times daily as needed for dizziness.  Dispense: 30 tablet; Refill: 0 - Comprehensive Metabolic Panel (CMET) - CBC with Differential - TSH  2. Nausea  - ondansetron (ZOFRAN) 4 MG tablet; Take 1 tablet (4 mg total) by mouth every 8 (eight) hours as needed for nausea or vomiting.  Dispense: 20 tablet; Refill: 0  The entirety of the information documented in the History of Present Illness, Review of Systems and Physical Exam were personally obtained by me. Portions of this information were initially documented by Rondel BatonSulibeya Dimas, CMA and reviewed by me for thoroughness and accuracy.      Trey SailorsAdriana M Abdias Hickam, PA-C  [facility]

## 2018-09-04 DIAGNOSIS — F331 Major depressive disorder, recurrent, moderate: Secondary | ICD-10-CM | POA: Diagnosis not present

## 2018-09-22 DIAGNOSIS — Z79899 Other long term (current) drug therapy: Secondary | ICD-10-CM | POA: Diagnosis not present

## 2018-09-25 DIAGNOSIS — F331 Major depressive disorder, recurrent, moderate: Secondary | ICD-10-CM | POA: Diagnosis not present

## 2018-10-02 DIAGNOSIS — M329 Systemic lupus erythematosus, unspecified: Secondary | ICD-10-CM | POA: Diagnosis not present

## 2018-10-02 DIAGNOSIS — M3214 Glomerular disease in systemic lupus erythematosus: Secondary | ICD-10-CM | POA: Diagnosis not present

## 2018-10-02 DIAGNOSIS — J849 Interstitial pulmonary disease, unspecified: Secondary | ICD-10-CM | POA: Diagnosis not present

## 2018-10-05 ENCOUNTER — Encounter: Payer: Medicaid Other | Admitting: Family Medicine

## 2018-10-22 ENCOUNTER — Encounter: Payer: Self-pay | Admitting: Emergency Medicine

## 2018-10-22 ENCOUNTER — Ambulatory Visit
Admission: EM | Admit: 2018-10-22 | Discharge: 2018-10-22 | Disposition: A | Discharge status: Home / Self Care | Payer: Medicaid Other | Attending: Family Medicine | Admitting: Family Medicine

## 2018-10-22 ENCOUNTER — Other Ambulatory Visit: Payer: Self-pay

## 2018-10-22 DIAGNOSIS — M546 Pain in thoracic spine: Secondary | ICD-10-CM | POA: Diagnosis not present

## 2018-10-22 LAB — URINALYSIS, COMPLETE (UACMP) WITH MICROSCOPIC
Glucose, UA: NEGATIVE mg/dL
Leukocytes,Ua: NEGATIVE
Nitrite: NEGATIVE
Protein, ur: >300 mg/dL — AB
Specific Gravity, Urine: >1.03 — ABNORMAL HIGH (ref 1.005–1.030)
pH: 5.5 (ref 5.0–8.0)

## 2018-10-22 MED ORDER — TRAMADOL HCL 50 MG PO TABS: 50.0000 mg | ORAL_TABLET | Freq: Three times a day (TID) | ORAL | 0 refills | Status: DC | PRN
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
## 2018-10-22 NOTE — ED Triage Notes (Signed)
Pt c/o back pain. Pain located in the flank area on the left side. Started 2 days ago. No known injury. She states that when she takes a deep breath the pain is worse. Denies urinary symptoms. Denies SOB or cough.

## 2018-10-22 NOTE — Discharge Instructions (Signed)
Medication as needed.  Rest.  If persists or worsens, go to the hospital.  Take care  Dr. Lacinda Axon

## 2018-10-24 ENCOUNTER — Other Ambulatory Visit: Payer: Self-pay

## 2018-10-24 ENCOUNTER — Emergency Department
Admission: EM | Admit: 2018-10-24 | Discharge: 2018-10-24 | Disposition: A | Discharge status: Home / Self Care | Payer: Medicaid Other | Attending: Emergency Medicine | Admitting: Emergency Medicine

## 2018-10-24 ENCOUNTER — Emergency Department: Payer: Medicaid Other

## 2018-10-24 ENCOUNTER — Encounter: Payer: Self-pay | Admitting: Emergency Medicine

## 2018-10-24 DIAGNOSIS — N390 Urinary tract infection, site not specified: Secondary | ICD-10-CM

## 2018-10-24 DIAGNOSIS — I129 Hypertensive chronic kidney disease with stage 1 through stage 4 chronic kidney disease, or unspecified chronic kidney disease: Secondary | ICD-10-CM | POA: Diagnosis not present

## 2018-10-24 DIAGNOSIS — N182 Chronic kidney disease, stage 2 (mild): Secondary | ICD-10-CM | POA: Insufficient documentation

## 2018-10-24 DIAGNOSIS — Z79899 Other long term (current) drug therapy: Secondary | ICD-10-CM | POA: Diagnosis not present

## 2018-10-24 DIAGNOSIS — M546 Pain in thoracic spine: Secondary | ICD-10-CM | POA: Insufficient documentation

## 2018-10-24 DIAGNOSIS — M549 Dorsalgia, unspecified: Secondary | ICD-10-CM | POA: Diagnosis present

## 2018-10-24 LAB — POCT PREGNANCY, URINE: Preg Test, Ur: NEGATIVE

## 2018-10-24 LAB — URINALYSIS, COMPLETE (UACMP) WITH MICROSCOPIC
Bacteria, UA: NONE SEEN
Bilirubin Urine: NEGATIVE
Glucose, UA: NEGATIVE mg/dL
Hgb urine dipstick: NEGATIVE
Ketones, ur: NEGATIVE mg/dL
Nitrite: NEGATIVE
Protein, ur: 100 mg/dL — AB
Specific Gravity, Urine: 1.027 (ref 1.005–1.030)
pH: 5 (ref 5.0–8.0)

## 2018-10-24 MED ORDER — OXYCODONE-ACETAMINOPHEN 5-325 MG PO TABS
1.0000 | ORAL_TABLET | Freq: Once | ORAL | Status: AC
  Administered 2018-10-24: 1 via ORAL
  Filled 2018-10-24: qty 1

## 2018-10-24 MED ORDER — SULFAMETHOXAZOLE-TRIMETHOPRIM 800-160 MG PO TABS: 1.0000 | ORAL_TABLET | Freq: Two times a day (BID) | ORAL | 0 refills | Status: DC

## 2018-10-24 NOTE — ED Notes (Signed)
Pt st that this throbbing type of back pain, has been going on since Tuesday. Pain is radiating around towards her ABD and hips. abd is not taut, but tender on the left hand side. Pt st that the tramadol she was prescribed for the pain is not helping control the pain. Pt NAD at this time. RR unlabored and even. Pt st that her stools have been more loose lately. No change in urinary pattern. No additional sx reported to this RN

## 2018-10-24 NOTE — ED Triage Notes (Signed)
Low back pain x 4 days. Denies fall or injury.

## 2018-10-24 NOTE — ED Notes (Signed)
Pt given water to sip and urine cup to produce specimen.

## 2018-10-24 NOTE — Discharge Instructions (Signed)
Follow-up with your primary care provider if any continued problems.  Increase fluids.  Continue taking the pain medication that your doctor prescribed for you every 8 hours.  Begin taking the antibiotic twice a day for the next 10 days.  You may also use moist heat or ice to your back as needed for discomfort.
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
## 2018-11-01 ENCOUNTER — Emergency Department
Admission: EM | Admit: 2018-11-01 | Discharge: 2018-11-01 | Disposition: A | Discharge status: Home / Self Care | Payer: Medicaid Other | Attending: Emergency Medicine | Admitting: Emergency Medicine

## 2018-11-01 ENCOUNTER — Other Ambulatory Visit: Payer: Self-pay

## 2018-11-01 ENCOUNTER — Emergency Department: Payer: Medicaid Other

## 2018-11-01 ENCOUNTER — Encounter: Payer: Self-pay | Admitting: Emergency Medicine

## 2018-11-01 DIAGNOSIS — S0083XA Contusion of other part of head, initial encounter: Secondary | ICD-10-CM | POA: Insufficient documentation

## 2018-11-01 DIAGNOSIS — Y929 Unspecified place or not applicable: Secondary | ICD-10-CM | POA: Diagnosis not present

## 2018-11-01 DIAGNOSIS — Z79899 Other long term (current) drug therapy: Secondary | ICD-10-CM | POA: Insufficient documentation

## 2018-11-01 DIAGNOSIS — I129 Hypertensive chronic kidney disease with stage 1 through stage 4 chronic kidney disease, or unspecified chronic kidney disease: Secondary | ICD-10-CM | POA: Insufficient documentation

## 2018-11-01 DIAGNOSIS — S0990XA Unspecified injury of head, initial encounter: Secondary | ICD-10-CM | POA: Diagnosis not present

## 2018-11-01 DIAGNOSIS — S199XXA Unspecified injury of neck, initial encounter: Secondary | ICD-10-CM | POA: Diagnosis not present

## 2018-11-01 DIAGNOSIS — G44311 Acute post-traumatic headache, intractable: Secondary | ICD-10-CM | POA: Diagnosis not present

## 2018-11-01 DIAGNOSIS — Y939 Activity, unspecified: Secondary | ICD-10-CM | POA: Insufficient documentation

## 2018-11-01 DIAGNOSIS — N182 Chronic kidney disease, stage 2 (mild): Secondary | ICD-10-CM | POA: Diagnosis not present

## 2018-11-01 DIAGNOSIS — Y999 Unspecified external cause status: Secondary | ICD-10-CM | POA: Insufficient documentation

## 2018-11-01 MED ORDER — HYDROCODONE-ACETAMINOPHEN 5-325 MG PO TABS
1.0000 | ORAL_TABLET | Freq: Once | ORAL | Status: AC
  Administered 2018-11-01: 1 via ORAL
  Filled 2018-11-01: qty 1

## 2018-11-01 MED ORDER — HYDROCODONE-ACETAMINOPHEN 5-325 MG PO TABS: 1.0000 | ORAL_TABLET | Freq: Three times a day (TID) | ORAL | 0 refills | Status: DC | PRN
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
## 2018-11-08 ENCOUNTER — Other Ambulatory Visit: Payer: Self-pay | Admitting: Neurology

## 2018-11-16 ENCOUNTER — Other Ambulatory Visit: Payer: Self-pay | Admitting: Neurology

## 2018-11-16 ENCOUNTER — Telehealth: Payer: Self-pay | Admitting: Neurology

## 2018-11-16 NOTE — Telephone Encounter (Signed)
Patient states that her pharmacy has sent [place] several times to get her aimovig  Refilled and has not heard anything from [place]  She uses the [facility] on [address]

## 2018-11-16 NOTE — Telephone Encounter (Signed)
Patient has not been seen in over a year (August 2019).  She will need to make a follow up appointment before any further refills will be prescribed.  Virtual visit is fine.

## 2018-11-17 NOTE — Telephone Encounter (Signed)
Left message on [phone] stating she needed to call office and make appt (virtual fine) as has not been seen in over a year. This needs to be made before MD will refill script. Left our number for her to call back and make appt.

## 2018-11-20 DIAGNOSIS — M3214 Glomerular disease in systemic lupus erythematosus: Secondary | ICD-10-CM | POA: Diagnosis not present

## 2018-11-27 DIAGNOSIS — F331 Major depressive disorder, recurrent, moderate: Secondary | ICD-10-CM | POA: Diagnosis not present

## 2018-11-30 DIAGNOSIS — F331 Major depressive disorder, recurrent, moderate: Secondary | ICD-10-CM | POA: Diagnosis not present

## 2018-12-02 ENCOUNTER — Encounter: Payer: Self-pay | Admitting: Family Medicine

## 2018-12-02 ENCOUNTER — Telehealth: Payer: Self-pay

## 2018-12-02 NOTE — Telephone Encounter (Signed)
Patient is requesting a refill on Ibuprofen 800 mg. Not on medication list.

## 2018-12-02 NOTE — Telephone Encounter (Signed)
Attempted to contact patient, no answer or voicemail. 

## 2018-12-02 NOTE — Telephone Encounter (Signed)
Would not recommend that with her kidney disease.  Also, it is on her allergy list

## 2018-12-03 DIAGNOSIS — M329 Systemic lupus erythematosus, unspecified: Secondary | ICD-10-CM | POA: Diagnosis not present

## 2018-12-03 DIAGNOSIS — Z23 Encounter for immunization: Secondary | ICD-10-CM | POA: Diagnosis not present

## 2018-12-03 DIAGNOSIS — N052 Unspecified nephritic syndrome with diffuse membranous glomerulonephritis: Secondary | ICD-10-CM | POA: Diagnosis not present

## 2018-12-03 NOTE — Telephone Encounter (Signed)
NA

## 2018-12-06 ENCOUNTER — Emergency Department: Payer: Medicaid Other

## 2018-12-06 ENCOUNTER — Emergency Department
Admission: EM | Admit: 2018-12-06 | Discharge: 2018-12-06 | Disposition: A | Discharge status: Home / Self Care | Payer: Medicaid Other | Attending: Emergency Medicine | Admitting: Emergency Medicine

## 2018-12-06 ENCOUNTER — Other Ambulatory Visit: Payer: Self-pay

## 2018-12-06 DIAGNOSIS — I129 Hypertensive chronic kidney disease with stage 1 through stage 4 chronic kidney disease, or unspecified chronic kidney disease: Secondary | ICD-10-CM | POA: Diagnosis not present

## 2018-12-06 DIAGNOSIS — M329 Systemic lupus erythematosus, unspecified: Secondary | ICD-10-CM | POA: Insufficient documentation

## 2018-12-06 DIAGNOSIS — Z20828 Contact with and (suspected) exposure to other viral communicable diseases: Secondary | ICD-10-CM | POA: Diagnosis not present

## 2018-12-06 DIAGNOSIS — IMO0002 Reserved for concepts with insufficient information to code with codable children: Secondary | ICD-10-CM

## 2018-12-06 DIAGNOSIS — Z79899 Other long term (current) drug therapy: Secondary | ICD-10-CM | POA: Diagnosis not present

## 2018-12-06 DIAGNOSIS — R5383 Other fatigue: Secondary | ICD-10-CM | POA: Diagnosis present

## 2018-12-06 DIAGNOSIS — R079 Chest pain, unspecified: Secondary | ICD-10-CM | POA: Diagnosis not present

## 2018-12-06 DIAGNOSIS — R531 Weakness: Secondary | ICD-10-CM | POA: Diagnosis not present

## 2018-12-06 DIAGNOSIS — R509 Fever, unspecified: Secondary | ICD-10-CM | POA: Diagnosis not present

## 2018-12-06 DIAGNOSIS — L930 Discoid lupus erythematosus: Secondary | ICD-10-CM | POA: Diagnosis not present

## 2018-12-06 DIAGNOSIS — N182 Chronic kidney disease, stage 2 (mild): Secondary | ICD-10-CM | POA: Insufficient documentation

## 2018-12-06 LAB — BASIC METABOLIC PANEL
Anion gap: 11 (ref 5–15)
BUN: 7 mg/dL (ref 6–20)
CO2: 18 mmol/L — ABNORMAL LOW (ref 22–32)
Calcium: 8.2 mg/dL — ABNORMAL LOW (ref 8.9–10.3)
Chloride: 112 mmol/L — ABNORMAL HIGH (ref 98–111)
Creatinine, Ser: 0.59 mg/dL (ref 0.44–1.00)
GFR calc Af Amer: >60 mL/min (ref >60)
GFR calc non Af Amer: >60 mL/min (ref >60)
Glucose, Bld: 89 mg/dL (ref 70–99)
Potassium: 3.1 mmol/L — ABNORMAL LOW (ref 3.5–5.1)
Sodium: 141 mmol/L (ref 135–145)

## 2018-12-06 LAB — CBC
HCT: 39.3 % (ref 36.0–46.0)
Hemoglobin: 12.8 g/dL (ref 12.0–15.0)
MCH: 30.6 pg (ref 26.0–34.0)
MCHC: 32.6 g/dL (ref 30.0–36.0)
MCV: 94 fL (ref 80.0–100.0)
Platelets: 262 10*3/uL (ref 150–400)
RBC: 4.18 MIL/uL (ref 3.87–5.11)
RDW: 15.1 % (ref 11.5–15.5)
WBC: 3.4 10*3/uL — ABNORMAL LOW (ref 4.0–10.5)
nRBC: 0 % (ref 0.0–0.2)

## 2018-12-06 LAB — TROPONIN I (HIGH SENSITIVITY): Troponin I (High Sensitivity): 4 ng/L (ref <18)

## 2018-12-06 MED ORDER — PREDNISONE 50 MG PO TABS: 50.0000 mg | ORAL_TABLET | Freq: Every day | ORAL | 0 refills | Status: DC

## 2018-12-06 MED ORDER — MORPHINE SULFATE (PF) 4 MG/ML IV SOLN
4.0000 mg | Freq: Once | INTRAVENOUS | Status: AC
  Administered 2018-12-06: 4 mg via INTRAVENOUS
  Filled 2018-12-06: qty 1

## 2018-12-06 MED ORDER — SODIUM CHLORIDE 0.9 % IV SOLN
1000.0000 mL | Freq: Once | INTRAVENOUS | Status: AC
  Administered 2018-12-06: 1000 mL via INTRAVENOUS

## 2018-12-06 MED ORDER — METHYLPREDNISOLONE SODIUM SUCC 125 MG IJ SOLR
125.0000 mg | Freq: Once | INTRAMUSCULAR | Status: AC
  Administered 2018-12-06: 125 mg via INTRAVENOUS
  Filled 2018-12-06: qty 2

## 2018-12-06 MED ORDER — OXYCODONE-ACETAMINOPHEN 5-325 MG PO TABS: 1.0000 | ORAL_TABLET | ORAL | 0 refills | Status: DC | PRN

## 2018-12-06 MED ORDER — OXYCODONE-ACETAMINOPHEN 5-325 MG PO TABS
2.0000 | ORAL_TABLET | Freq: Once | ORAL | Status: AC
  Administered 2018-12-06: 2 via ORAL
  Filled 2018-12-06: qty 2

## 2018-12-06 MED ORDER — ONDANSETRON HCL 4 MG/2ML IJ SOLN
4.0000 mg | Freq: Once | INTRAMUSCULAR | Status: AC
  Administered 2018-12-06: 4 mg via INTRAVENOUS
  Filled 2018-12-06: qty 2
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
## 2018-12-07 DIAGNOSIS — R197 Diarrhea, unspecified: Secondary | ICD-10-CM | POA: Diagnosis not present

## 2018-12-07 DIAGNOSIS — R630 Anorexia: Secondary | ICD-10-CM | POA: Diagnosis not present

## 2018-12-07 DIAGNOSIS — Z20828 Contact with and (suspected) exposure to other viral communicable diseases: Secondary | ICD-10-CM | POA: Diagnosis not present

## 2018-12-07 DIAGNOSIS — R531 Weakness: Secondary | ICD-10-CM | POA: Diagnosis not present

## 2018-12-07 DIAGNOSIS — Z79899 Other long term (current) drug therapy: Secondary | ICD-10-CM | POA: Diagnosis not present

## 2018-12-07 DIAGNOSIS — R52 Pain, unspecified: Secondary | ICD-10-CM | POA: Diagnosis not present

## 2018-12-07 DIAGNOSIS — Z5181 Encounter for therapeutic drug level monitoring: Secondary | ICD-10-CM | POA: Diagnosis not present

## 2018-12-07 DIAGNOSIS — M329 Systemic lupus erythematosus, unspecified: Secondary | ICD-10-CM | POA: Diagnosis not present

## 2018-12-07 DIAGNOSIS — I10 Essential (primary) hypertension: Secondary | ICD-10-CM | POA: Diagnosis not present

## 2018-12-07 LAB — SARS CORONAVIRUS 2 (TAT 6-24 HRS): SARS Coronavirus 2: NEGATIVE

## 2018-12-08 ENCOUNTER — Encounter: Payer: Self-pay | Admitting: Emergency Medicine

## 2018-12-08 ENCOUNTER — Inpatient Hospital Stay
Admission: EM | Admit: 2018-12-08 | Discharge: 2018-12-12 | DRG: 372 | Disposition: A | Discharge status: Home / Self Care | Payer: Medicaid Other | Attending: Internal Medicine | Admitting: Internal Medicine

## 2018-12-08 ENCOUNTER — Other Ambulatory Visit: Payer: Self-pay

## 2018-12-08 DIAGNOSIS — F419 Anxiety disorder, unspecified: Secondary | ICD-10-CM | POA: Diagnosis present

## 2018-12-08 DIAGNOSIS — Z20828 Contact with and (suspected) exposure to other viral communicable diseases: Secondary | ICD-10-CM | POA: Diagnosis present

## 2018-12-08 DIAGNOSIS — J45909 Unspecified asthma, uncomplicated: Secondary | ICD-10-CM | POA: Diagnosis present

## 2018-12-08 DIAGNOSIS — I129 Hypertensive chronic kidney disease with stage 1 through stage 4 chronic kidney disease, or unspecified chronic kidney disease: Secondary | ICD-10-CM | POA: Diagnosis present

## 2018-12-08 DIAGNOSIS — R8271 Bacteriuria: Secondary | ICD-10-CM | POA: Diagnosis present

## 2018-12-08 DIAGNOSIS — Z7952 Long term (current) use of systemic steroids: Secondary | ICD-10-CM

## 2018-12-08 DIAGNOSIS — E876 Hypokalemia: Secondary | ICD-10-CM | POA: Diagnosis present

## 2018-12-08 DIAGNOSIS — G629 Polyneuropathy, unspecified: Secondary | ICD-10-CM | POA: Diagnosis present

## 2018-12-08 DIAGNOSIS — Z8249 Family history of ischemic heart disease and other diseases of the circulatory system: Secondary | ICD-10-CM

## 2018-12-08 DIAGNOSIS — R197 Diarrhea, unspecified: Secondary | ICD-10-CM | POA: Diagnosis not present

## 2018-12-08 DIAGNOSIS — K219 Gastro-esophageal reflux disease without esophagitis: Secondary | ICD-10-CM | POA: Diagnosis present

## 2018-12-08 DIAGNOSIS — M329 Systemic lupus erythematosus, unspecified: Secondary | ICD-10-CM

## 2018-12-08 DIAGNOSIS — N182 Chronic kidney disease, stage 2 (mild): Secondary | ICD-10-CM | POA: Diagnosis present

## 2018-12-08 DIAGNOSIS — J849 Interstitial pulmonary disease, unspecified: Secondary | ICD-10-CM | POA: Diagnosis present

## 2018-12-08 DIAGNOSIS — Z79891 Long term (current) use of opiate analgesic: Secondary | ICD-10-CM

## 2018-12-08 DIAGNOSIS — L930 Discoid lupus erythematosus: Secondary | ICD-10-CM | POA: Diagnosis not present

## 2018-12-08 DIAGNOSIS — Z833 Family history of diabetes mellitus: Secondary | ICD-10-CM

## 2018-12-08 DIAGNOSIS — Z9981 Dependence on supplemental oxygen: Secondary | ICD-10-CM

## 2018-12-08 DIAGNOSIS — Z79899 Other long term (current) drug therapy: Secondary | ICD-10-CM

## 2018-12-08 DIAGNOSIS — Z9049 Acquired absence of other specified parts of digestive tract: Secondary | ICD-10-CM

## 2018-12-08 DIAGNOSIS — E785 Hyperlipidemia, unspecified: Secondary | ICD-10-CM | POA: Diagnosis present

## 2018-12-08 DIAGNOSIS — A0472 Enterocolitis due to Clostridium difficile, not specified as recurrent: Principal | ICD-10-CM | POA: Diagnosis present

## 2018-12-08 DIAGNOSIS — R52 Pain, unspecified: Secondary | ICD-10-CM

## 2018-12-08 DIAGNOSIS — F329 Major depressive disorder, single episode, unspecified: Secondary | ICD-10-CM | POA: Diagnosis present

## 2018-12-08 DIAGNOSIS — Z888 Allergy status to other drugs, medicaments and biological substances status: Secondary | ICD-10-CM

## 2018-12-08 DIAGNOSIS — Z7951 Long term (current) use of inhaled steroids: Secondary | ICD-10-CM

## 2018-12-08 LAB — COMPREHENSIVE METABOLIC PANEL
ALT: 10 U/L (ref 0–44)
AST: 15 U/L (ref 15–41)
Albumin: 3.3 g/dL — ABNORMAL LOW (ref 3.5–5.0)
Alkaline Phosphatase: 49 U/L (ref 38–126)
Anion gap: 10 (ref 5–15)
BUN: 12 mg/dL (ref 6–20)
CO2: 22 mmol/L (ref 22–32)
Calcium: 8.2 mg/dL — ABNORMAL LOW (ref 8.9–10.3)
Chloride: 108 mmol/L (ref 98–111)
Creatinine, Ser: 0.71 mg/dL (ref 0.44–1.00)
GFR calc Af Amer: >60 mL/min (ref >60)
GFR calc non Af Amer: >60 mL/min (ref >60)
Glucose, Bld: 90 mg/dL (ref 70–99)
Potassium: 2.8 mmol/L — ABNORMAL LOW (ref 3.5–5.1)
Sodium: 140 mmol/L (ref 135–145)
Total Bilirubin: 0.5 mg/dL (ref 0.3–1.2)
Total Protein: 6.4 g/dL — ABNORMAL LOW (ref 6.5–8.1)

## 2018-12-08 LAB — CBC
HCT: 41.2 % (ref 36.0–46.0)
Hemoglobin: 13.3 g/dL (ref 12.0–15.0)
MCH: 30.3 pg (ref 26.0–34.0)
MCHC: 32.3 g/dL (ref 30.0–36.0)
MCV: 93.8 fL (ref 80.0–100.0)
Platelets: 283 10*3/uL (ref 150–400)
RBC: 4.39 MIL/uL (ref 3.87–5.11)
RDW: 15.4 % (ref 11.5–15.5)
WBC: 4.3 10*3/uL (ref 4.0–10.5)
nRBC: 0 % (ref 0.0–0.2)

## 2018-12-08 LAB — URINALYSIS, COMPLETE (UACMP) WITH MICROSCOPIC
Bacteria, UA: NONE SEEN
Bilirubin Urine: NEGATIVE
Glucose, UA: NEGATIVE mg/dL
Ketones, ur: NEGATIVE mg/dL
Nitrite: NEGATIVE
Protein, ur: >=300 mg/dL — AB
Specific Gravity, Urine: 1.022 (ref 1.005–1.030)
pH: 6 (ref 5.0–8.0)

## 2018-12-08 LAB — CK: Total CK: 47 U/L (ref 38–234)

## 2018-12-08 LAB — SEDIMENTATION RATE: Sed Rate: 22 mm/hr — ABNORMAL HIGH (ref 0–20)

## 2018-12-08 LAB — LIPASE, BLOOD: Lipase: 36 U/L (ref 11–51)

## 2018-12-08 LAB — POCT PREGNANCY, URINE: Preg Test, Ur: NEGATIVE

## 2018-12-08 MED ORDER — SODIUM CHLORIDE 0.9 % IV BOLUS
1000.0000 mL | Freq: Once | INTRAVENOUS | Status: AC
  Administered 2018-12-08: 1000 mL via INTRAVENOUS

## 2018-12-08 NOTE — Telephone Encounter (Signed)
Patient advised as below.  

## 2018-12-08 NOTE — ED Notes (Signed)
ED Provider at bedside. 

## 2018-12-08 NOTE — ED Notes (Addendum)
IV access attempt unsuccessful x 1 by both this RN and Romie Minus, Dr. Karma Greaser informed, will place IV team consult

## 2018-12-08 NOTE — ED Notes (Signed)
Patient was just here for same symptoms that is not getting any better with curreny treatment. Patient states she is still having diarrhea and generalized body pains.

## 2018-12-08 NOTE — ED Notes (Signed)
Lab to draw SST tube

## 2018-12-08 NOTE — ED Notes (Signed)
Patient states she has had 5 loose stools today.

## 2018-12-08 NOTE — ED Triage Notes (Signed)
PT arrived via POV with reports of continued diarrhea and body aches, pt seen here on Sunday for the same states sxs are not any better. Continues to have poor appetite.    Pt has hx of lupus, has been taking steroids and oxycodone but is not helping.  Pt states she has followed up with her Dr.  Abbott Camacho states she was seen at [facility] last night for sxs as well

## 2018-12-08 NOTE — ED Notes (Signed)
Lab at bedside

## 2018-12-08 NOTE — ED Provider Notes (Signed)
-----------------------------------------   11:05 PM on 12/08/2018 -----------------------------------------  Assuming care from Dr. Cinda Quest.  In short, Faith Camacho is a 28 y.o. female with a chief complaint of pain.  Refer to the original H&P for additional details.  The current plan of care is to follow up labs and reassess.   ----------------------------------------- 12:58 AM on 12/09/2018 -----------------------------------------  Labs are generally reassuring except for the proteinuria and her steadily decreasing potassium, now at 2.8.  I have ordered 40 mEq by mouth for repletion as well as 2 g of magnesium and IV potassium as well to be administered overnight at a slow rate.  I discussed with the patient the fact that she would be better off going to her follow-up appointment which was offered by her rheumatologist at [facility] either this morning or on Friday (there were telephone notes offering both options).  However she feels that she is not able to go because she is in too much pain and because she is not able to eat or drink anything and feels too weak.  I have discussed the case with the hospitalist, Dr. Jannifer Franklin, and he will admit for additional treatment and rheumatology consult.   Hinda Kehr, MD 12/09/18 [phone]

## 2018-12-08 NOTE — Progress Notes (Signed)
Responded to PIV consult. Per ED RN, PIV recently obtained and consult no longer needed.
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
## 2018-12-09 DIAGNOSIS — F329 Major depressive disorder, single episode, unspecified: Secondary | ICD-10-CM | POA: Diagnosis present

## 2018-12-09 DIAGNOSIS — Z7952 Long term (current) use of systemic steroids: Secondary | ICD-10-CM | POA: Diagnosis not present

## 2018-12-09 DIAGNOSIS — N182 Chronic kidney disease, stage 2 (mild): Secondary | ICD-10-CM | POA: Diagnosis present

## 2018-12-09 DIAGNOSIS — L930 Discoid lupus erythematosus: Secondary | ICD-10-CM | POA: Diagnosis not present

## 2018-12-09 DIAGNOSIS — E785 Hyperlipidemia, unspecified: Secondary | ICD-10-CM | POA: Diagnosis present

## 2018-12-09 DIAGNOSIS — Z9981 Dependence on supplemental oxygen: Secondary | ICD-10-CM | POA: Diagnosis not present

## 2018-12-09 DIAGNOSIS — J45909 Unspecified asthma, uncomplicated: Secondary | ICD-10-CM | POA: Diagnosis present

## 2018-12-09 DIAGNOSIS — R52 Pain, unspecified: Secondary | ICD-10-CM | POA: Diagnosis not present

## 2018-12-09 DIAGNOSIS — Z7951 Long term (current) use of inhaled steroids: Secondary | ICD-10-CM | POA: Diagnosis not present

## 2018-12-09 DIAGNOSIS — J849 Interstitial pulmonary disease, unspecified: Secondary | ICD-10-CM | POA: Diagnosis present

## 2018-12-09 DIAGNOSIS — I129 Hypertensive chronic kidney disease with stage 1 through stage 4 chronic kidney disease, or unspecified chronic kidney disease: Secondary | ICD-10-CM | POA: Diagnosis present

## 2018-12-09 DIAGNOSIS — Z888 Allergy status to other drugs, medicaments and biological substances status: Secondary | ICD-10-CM | POA: Diagnosis not present

## 2018-12-09 DIAGNOSIS — Z79891 Long term (current) use of opiate analgesic: Secondary | ICD-10-CM | POA: Diagnosis not present

## 2018-12-09 DIAGNOSIS — Z20828 Contact with and (suspected) exposure to other viral communicable diseases: Secondary | ICD-10-CM | POA: Diagnosis present

## 2018-12-09 DIAGNOSIS — Z8249 Family history of ischemic heart disease and other diseases of the circulatory system: Secondary | ICD-10-CM | POA: Diagnosis not present

## 2018-12-09 DIAGNOSIS — R197 Diarrhea, unspecified: Secondary | ICD-10-CM | POA: Diagnosis not present

## 2018-12-09 DIAGNOSIS — Z9049 Acquired absence of other specified parts of digestive tract: Secondary | ICD-10-CM | POA: Diagnosis not present

## 2018-12-09 DIAGNOSIS — K219 Gastro-esophageal reflux disease without esophagitis: Secondary | ICD-10-CM | POA: Diagnosis present

## 2018-12-09 DIAGNOSIS — G629 Polyneuropathy, unspecified: Secondary | ICD-10-CM | POA: Diagnosis present

## 2018-12-09 DIAGNOSIS — A0472 Enterocolitis due to Clostridium difficile, not specified as recurrent: Secondary | ICD-10-CM | POA: Diagnosis present

## 2018-12-09 DIAGNOSIS — M329 Systemic lupus erythematosus, unspecified: Secondary | ICD-10-CM | POA: Diagnosis not present

## 2018-12-09 DIAGNOSIS — F419 Anxiety disorder, unspecified: Secondary | ICD-10-CM | POA: Diagnosis present

## 2018-12-09 DIAGNOSIS — Z833 Family history of diabetes mellitus: Secondary | ICD-10-CM | POA: Diagnosis not present

## 2018-12-09 DIAGNOSIS — E876 Hypokalemia: Secondary | ICD-10-CM | POA: Diagnosis not present

## 2018-12-09 DIAGNOSIS — N390 Urinary tract infection, site not specified: Secondary | ICD-10-CM | POA: Diagnosis not present

## 2018-12-09 DIAGNOSIS — Z79899 Other long term (current) drug therapy: Secondary | ICD-10-CM | POA: Diagnosis not present

## 2018-12-09 DIAGNOSIS — R8271 Bacteriuria: Secondary | ICD-10-CM | POA: Diagnosis present

## 2018-12-09 LAB — CBC
HCT: 34.4 % — ABNORMAL LOW (ref 36.0–46.0)
Hemoglobin: 11 g/dL — ABNORMAL LOW (ref 12.0–15.0)
MCH: 30.3 pg (ref 26.0–34.0)
MCHC: 32 g/dL (ref 30.0–36.0)
MCV: 94.8 fL (ref 80.0–100.0)
Platelets: 260 10*3/uL (ref 150–400)
RBC: 3.63 MIL/uL — ABNORMAL LOW (ref 3.87–5.11)
RDW: 15 % (ref 11.5–15.5)
WBC: 5.1 10*3/uL (ref 4.0–10.5)
nRBC: 0 % (ref 0.0–0.2)

## 2018-12-09 LAB — BASIC METABOLIC PANEL
Anion gap: 6 (ref 5–15)
BUN: 9 mg/dL (ref 6–20)
CO2: 23 mmol/L (ref 22–32)
Calcium: 7.7 mg/dL — ABNORMAL LOW (ref 8.9–10.3)
Chloride: 111 mmol/L (ref 98–111)
Creatinine, Ser: 0.56 mg/dL (ref 0.44–1.00)
GFR calc Af Amer: >60 mL/min (ref >60)
GFR calc non Af Amer: >60 mL/min (ref >60)
Glucose, Bld: 88 mg/dL (ref 70–99)
Potassium: 3.5 mmol/L (ref 3.5–5.1)
Sodium: 140 mmol/L (ref 135–145)

## 2018-12-09 LAB — MAGNESIUM: Magnesium: 2 mg/dL (ref 1.7–2.4)

## 2018-12-09 LAB — C DIFFICILE QUICK SCREEN W PCR REFLEX
C Diff antigen: POSITIVE — AB
C Diff interpretation: DETECTED
C Diff toxin: POSITIVE — AB

## 2018-12-09 LAB — C-REACTIVE PROTEIN: CRP: 1 mg/dL — ABNORMAL HIGH (ref <1.0)

## 2018-12-09 LAB — SARS CORONAVIRUS 2 (TAT 6-24 HRS): SARS Coronavirus 2: NEGATIVE

## 2018-12-09 MED ORDER — PANTOPRAZOLE SODIUM 40 MG PO TBEC
40.0000 mg | DELAYED_RELEASE_TABLET | Freq: Every day | ORAL | Status: DC
  Administered 2018-12-09 – 2018-12-12 (×4): 40 mg via ORAL
  Filled 2018-12-09 (×4): qty 1

## 2018-12-09 MED ORDER — MAGNESIUM SULFATE 2 GM/50ML IV SOLN
2.0000 g | Freq: Once | INTRAVENOUS | Status: AC
  Administered 2018-12-09: 2 g via INTRAVENOUS
  Filled 2018-12-09: qty 50

## 2018-12-09 MED ORDER — POTASSIUM CHLORIDE 10 MEQ/100ML IV SOLN
10.0000 meq | INTRAVENOUS | Status: AC
  Administered 2018-12-09: 10 meq via INTRAVENOUS
  Filled 2018-12-09 (×4): qty 100

## 2018-12-09 MED ORDER — POTASSIUM CHLORIDE IN NACL 40-0.9 MEQ/L-% IV SOLN
INTRAVENOUS | Status: DC
  Administered 2018-12-09 – 2018-12-10 (×3): 100 mL/h via INTRAVENOUS
  Filled 2018-12-09 (×6): qty 1000

## 2018-12-09 MED ORDER — METHYLPREDNISOLONE SODIUM SUCC 125 MG IJ SOLR
60.0000 mg | Freq: Two times a day (BID) | INTRAMUSCULAR | Status: DC
  Administered 2018-12-09: 60 mg via INTRAVENOUS
  Filled 2018-12-09: qty 0.96
  Filled 2018-12-09: qty 2

## 2018-12-09 MED ORDER — TACROLIMUS 0.5 MG PO CAPS
0.5000 mg | ORAL_CAPSULE | Freq: Two times a day (BID) | ORAL | Status: DC
  Administered 2018-12-09 – 2018-12-12 (×7): 0.5 mg via ORAL
  Filled 2018-12-09 (×9): qty 1

## 2018-12-09 MED ORDER — VANCOMYCIN 50 MG/ML ORAL SOLUTION
125.0000 mg | Freq: Four times a day (QID) | ORAL | Status: DC
  Administered 2018-12-09 – 2018-12-12 (×11): 125 mg via ORAL
  Filled 2018-12-09 (×18): qty 2.5

## 2018-12-09 MED ORDER — PREDNISONE 50 MG PO TABS
50.0000 mg | ORAL_TABLET | Freq: Every day | ORAL | Status: DC
  Administered 2018-12-10 – 2018-12-11 (×2): 50 mg via ORAL
  Filled 2018-12-09 (×2): qty 1

## 2018-12-09 MED ORDER — HYDROXYZINE HCL 10 MG PO TABS
10.0000 mg | ORAL_TABLET | Freq: Three times a day (TID) | ORAL | Status: DC | PRN
  Filled 2018-12-09: qty 1

## 2018-12-09 MED ORDER — ACETAMINOPHEN 325 MG PO TABS: 650.0000 mg | ORAL_TABLET | Freq: Four times a day (QID) | ORAL | Status: DC | PRN

## 2018-12-09 MED ORDER — ALBUTEROL SULFATE HFA 108 (90 BASE) MCG/ACT IN AERS
2.0000 | INHALATION_SPRAY | RESPIRATORY_TRACT | Status: DC | PRN
  Filled 2018-12-09: qty 6.7

## 2018-12-09 MED ORDER — ONDANSETRON HCL 4 MG/2ML IJ SOLN
4.0000 mg | INTRAMUSCULAR | Status: AC
  Administered 2018-12-09: 4 mg via INTRAVENOUS
  Filled 2018-12-09: qty 2

## 2018-12-09 MED ORDER — POTASSIUM CHLORIDE CRYS ER 20 MEQ PO TBCR
40.0000 meq | EXTENDED_RELEASE_TABLET | Freq: Once | ORAL | Status: AC
  Administered 2018-12-09: 40 meq via ORAL
  Filled 2018-12-09: qty 2

## 2018-12-09 MED ORDER — CITALOPRAM HYDROBROMIDE 20 MG PO TABS
20.0000 mg | ORAL_TABLET | Freq: Every day | ORAL | Status: DC
  Administered 2018-12-09 – 2018-12-11 (×3): 20 mg via ORAL
  Filled 2018-12-09 (×5): qty 1

## 2018-12-09 MED ORDER — ONDANSETRON HCL 4 MG PO TABS
4.0000 mg | ORAL_TABLET | Freq: Four times a day (QID) | ORAL | Status: DC | PRN
  Filled 2018-12-09: qty 1

## 2018-12-09 MED ORDER — ONDANSETRON HCL 4 MG/2ML IJ SOLN: 4.0000 mg | Freq: Four times a day (QID) | INTRAMUSCULAR | Status: DC | PRN

## 2018-12-09 MED ORDER — OXYCODONE-ACETAMINOPHEN 5-325 MG PO TABS
1.0000 | ORAL_TABLET | ORAL | Status: DC | PRN
  Administered 2018-12-09 – 2018-12-12 (×8): 1 via ORAL
  Filled 2018-12-09 (×8): qty 1

## 2018-12-09 MED ORDER — HYDROXYCHLOROQUINE SULFATE 200 MG PO TABS
200.0000 mg | ORAL_TABLET | Freq: Two times a day (BID) | ORAL | Status: DC
  Administered 2018-12-09 – 2018-12-12 (×7): 200 mg via ORAL
  Filled 2018-12-09 (×8): qty 1

## 2018-12-09 MED ORDER — MORPHINE SULFATE (PF) 2 MG/ML IV SOLN
2.0000 mg | INTRAVENOUS | Status: DC | PRN
  Administered 2018-12-09 – 2018-12-12 (×10): 2 mg via INTRAVENOUS
  Filled 2018-12-09 (×10): qty 1

## 2018-12-09 MED ORDER — OXYCODONE-ACETAMINOPHEN 5-325 MG PO TABS: 1.0000 | ORAL_TABLET | ORAL | Status: DC | PRN

## 2018-12-09 MED ORDER — MORPHINE SULFATE (PF) 4 MG/ML IV SOLN
4.0000 mg | Freq: Once | INTRAVENOUS | Status: AC
  Administered 2018-12-09: 4 mg via INTRAVENOUS
  Filled 2018-12-09: qty 1

## 2018-12-09 MED ORDER — GABAPENTIN 300 MG PO CAPS
300.0000 mg | ORAL_CAPSULE | Freq: Two times a day (BID) | ORAL | Status: DC
  Administered 2018-12-09 – 2018-12-12 (×7): 300 mg via ORAL
  Filled 2018-12-09 (×8): qty 1

## 2018-12-09 MED ORDER — POTASSIUM CHLORIDE CRYS ER 10 MEQ PO TBCR
10.0000 meq | EXTENDED_RELEASE_TABLET | Freq: Every day | ORAL | Status: DC
  Administered 2018-12-09 – 2018-12-12 (×4): 10 meq via ORAL
  Filled 2018-12-09 (×4): qty 1

## 2018-12-09 MED ORDER — CALCIUM CARBONATE-VITAMIN D 500-200 MG-UNIT PO TABS
1.0000 | ORAL_TABLET | Freq: Two times a day (BID) | ORAL | Status: DC
  Administered 2018-12-09 – 2018-12-12 (×7): 1 via ORAL
  Filled 2018-12-09 (×8): qty 1

## 2018-12-09 MED ORDER — ACETAMINOPHEN 650 MG RE SUPP
650.0000 mg | Freq: Four times a day (QID) | RECTAL | Status: DC | PRN
  Filled 2018-12-09: qty 1

## 2018-12-09 MED ORDER — BECLOMETHASONE DIPROPIONATE 80 MCG/ACT IN AERS: 2.0000 | INHALATION_SPRAY | Freq: Two times a day (BID) | RESPIRATORY_TRACT | Status: DC

## 2018-12-09 MED ORDER — LOSARTAN POTASSIUM 50 MG PO TABS
100.0000 mg | ORAL_TABLET | Freq: Every day | ORAL | Status: DC
  Administered 2018-12-09 – 2018-12-12 (×4): 100 mg via ORAL
  Filled 2018-12-09 (×4): qty 2

## 2018-12-09 MED ORDER — ENOXAPARIN SODIUM 40 MG/0.4ML ~~LOC~~ SOLN
40.0000 mg | SUBCUTANEOUS | Status: DC
  Administered 2018-12-09 – 2018-12-11 (×3): 40 mg via SUBCUTANEOUS
  Filled 2018-12-09 (×4): qty 0.4

## 2018-12-09 MED ORDER — TRAZODONE HCL 50 MG PO TABS
25.0000 mg | ORAL_TABLET | Freq: Every evening | ORAL | Status: DC | PRN
  Filled 2018-12-09: qty 0.5

## 2018-12-09 NOTE — ED Notes (Signed)
Per pharmacy, magnesium and potassium IV can be infused together

## 2018-12-09 NOTE — ED Notes (Signed)
Pt unable to provide stool sample after going to the toilet

## 2018-12-09 NOTE — ED Notes (Signed)
Report given to Ashley, RN

## 2018-12-09 NOTE — ED Notes (Signed)
IV team able to get new IV in place, pt's IV potassium restarted at a rate of 52ml/hr which pt is able to tolerate.

## 2018-12-09 NOTE — ED Notes (Signed)
Pt assisted to bathroom

## 2018-12-09 NOTE — ED Notes (Signed)
Pt tolerating potassium infusing at 69ml/hr.

## 2018-12-09 NOTE — ED Notes (Addendum)
Pt c/o pain at iv site. NS and potassium infusing at this time. Potassium rate slowed with no improvement. No obvious swelling or redness noted at iv site. Charge nurse aware. Potassium stopped temporarily until IV is able to placed in different location.

## 2018-12-09 NOTE — ED Notes (Signed)
Waiting on PO vanc to come from pharmacy

## 2018-12-09 NOTE — Consult Note (Signed)
Reason for Consult: Systemic lupus  Referring Physician: Hospitalist  Rosaland Lao   HPI: 28 year old African-American female.  Long history of systemic lupus initially diagnosed age 16.  She has had membranous nephropathy.  Previously had Cytoxan and chronic CellCept.  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  She has been on steroids tacrolimus and Plaquenil.  Prior MMEF Prior history of chronic pain.  Up through 2018 she was seen in the pain clinic.  Does have scoliosis in the back For the last several days she has not felt well.  Says she is hurting all over.  Not really muscles or joints.  Nothing has been swollen.  She had mild low-grade fever.  For the last 4 days she has had diarrhea.  Recent UTI prior antibiotic.  Was seen in the ER on 2 occasions.  White count 4000.  Hemoglobin 13.  Creatinine 0.7.  Urinalysis shows proteinuria but no significant pyuria.  She has had 4 loose stools a day.  Some lower chest pain mild epigastric pain but no nausea or vomiting.  Recent complements on 09/2018 were normal.  Recent sed rate 22.  CRP 1 Concern was for lupus flare.  She has been on 5 mg of prednisone until 2 days ago where she took 68.  Did not help her pain all over or her diarrhea  PMH: As above.  Chronic lupus.  Interstitial lung disease.  Age 45.  Had hemoptysis.  Prior lung biopsy shows hemosiderin.  Chest x-ray showed some mild pulmonary fibrosis.  Does require oxygen  SURGICAL HISTORY: No joint surgeries  Family History: No family history of connective tissue disease  Social History: Denies illicit drug use.  No cigarettes or alcohol  Allergies:  Allergies  Allergen Reactions  . Ibuprofen Other (See Comments)    Can't take because of lupus    Medications:  Scheduled: . beclomethasone  2 puff Inhalation BID  . calcium-vitamin D  1 tablet Oral BID  . citalopram  20 mg Oral Daily  . enoxaparin (LOVENOX) injection  40 mg Subcutaneous  Q24H  . gabapentin  300 mg Oral BID  . hydroxychloroquine  200 mg Oral BID  . losartan  100 mg Oral Daily  . methylPREDNISolone (SOLU-MEDROL) injection  60 mg Intravenous Q12H  . pantoprazole  40 mg Oral Daily  . potassium chloride  10 mEq Oral Daily  . tacrolimus  0.5 mg Oral BID  . vancomycin  125 mg Oral Q6H        ROS: No abdominal pain.  No blood per stool or per rectum.  No nausea.  No vomiting.  No photosensitive skin rash.  No recent alopecia.  No oral ulcers.  No significant Raynaud's.   PHYSICAL EXAM: Blood pressure (!) 130/96, pulse 77, temperature 98.4 F (36.9 C), temperature source Oral, resp. rate 16, height 4\' 11"  (1.499 m), weight 74.8 kg, last menstrual period 12/06/2018, SpO2 100 %. Pleasant female.  Seen resting in bed no acute distress.  Sclera clear.  Skin without rashes.  No significant discoid lesions of the face or ears.  Oropharynx clear.  No thyromegaly.  Clear chest.  Chest wall is minimally tender.  Mild diffuse abdominal tenderness but good bowel sounds.  No murmur.  No visceromegaly.  No significant edema Musculoskeletal good range of motion neck and shoulders.  Hips move well.  Knees without effusions.  Wrists MCPs PIPs without synovitis.  MTPs nontender.  Muscles are not particularly tender to touch or  palpate Neurologic: Symmetric brisk lower extremity reflexes and upper extremity reflexes.  She has 5/5 biceps triceps and hip flexor power.  Symmetric DTRs  Assessment: New onset loose stools.  Prior antibiotic for UTI.  Stool positive for C. difficile Chronic lupus on prednisone Plaquenil recently CellCept and recently started on Prograf.  Recent complements were normal.  Sed rate CRP and not elevated.  She has no significant cytopenia.  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  Normal CBC and creatinine  Concern about pain syndrome.  Asking for  analgesics  Hypokalemia  Recommendations: Appropriate treatment of C. Difficile Continue her usual lupus meds. I would be hesitant to give her high-dose steroids as we have no clear indication and it may exacerbate her recovery of the C. Difficile.  If she is admitted, consider checking antidouble-stranded DNA and C3-C4. Analgesics as appropriate    Emmaline Kluver 12/09/2018, 1:03 PM

## 2018-12-09 NOTE — Progress Notes (Signed)
[facility] [place] NAME: Anjolaoluwa Siguenza    MR#:  [id number]  DATE OF BIRTH:  1990/08/26  SUBJECTIVE:  CHIEF COMPLAINT:   Chief Complaint  Patient presents with  . Diarrhea  . Generalized Body Aches   No new complaint this morning.  Patient presented with diarrhea.  Notified that C. difficile came back positive.  Patient started on p.o. vancomycin.  No fevers.  REVIEW OF SYSTEMS:  Review of Systems  Constitutional: Negative for chills and fever.  HENT: Negative for hearing loss and tinnitus.   Eyes: Negative for blurred vision.  Respiratory: Negative for cough and shortness of breath.   Cardiovascular: Negative for chest pain and palpitations.  Gastrointestinal: Positive for diarrhea. Negative for heartburn, nausea and vomiting.  Genitourinary: Negative for dysuria and urgency.  Musculoskeletal: Positive for myalgias. Negative for back pain.  Skin: Negative for itching and rash.  Neurological: Negative for dizziness and headaches.  Psychiatric/Behavioral: Negative for depression and hallucinations.    DRUG ALLERGIES:   Allergies  Allergen Reactions  . Ibuprofen Other (See Comments)    Can't take because of lupus   VITALS:  Blood pressure (!) 130/96, pulse 77, temperature 98.4 F (36.9 C), temperature source Oral, resp. rate 16, height 4\' 11"  (1.499 m), weight 74.8 kg, last menstrual period 12/06/2018, SpO2 100 %. PHYSICAL EXAMINATION:  Physical Exam  GENERAL:  28 y.o.-year-old African-American female patient lying in the bed with no acute distress.  EYES: Pupils equal, round, reactive to light and accommodation. No scleral icterus. Extraocular muscles intact.  HEENT: Head atraumatic, normocephalic. Oropharynx and nasopharynx clear.  NECK:  Supple, no jugular venous distention. No thyroid enlargement, no tenderness.  LUNGS: Normal breath sounds bilaterally, no wheezing, rales,rhonchi or crepitation. No use of accessory  muscles of respiration.  CARDIOVASCULAR: Regular rate and rhythm, S1, S2 normal. No murmurs, rubs, or gallops.  ABDOMEN: Soft, nondistended, nontender. Bowel sounds present. No organomegaly or mass.  EXTREMITIES: No pedal edema, cyanosis, or clubbing.  NEUROLOGIC: Cranial nerves II through XII are intact. Muscle strength 5/5 in all extremities. Sensation intact. Gait not checked.  PSYCHIATRIC: The patient is alert and oriented x 3.  Normal affect and good eye contact. SKIN: No obvious rash, lesion, or ulcer.   LABORATORY PANEL:  Female CBC Recent Labs  Lab 12/09/18 0605  WBC 5.1  HGB 11.0*  HCT 34.4*  PLT 260   ------------------------------------------------------------------------------------------------------------------ Chemistries  Recent Labs  Lab 12/08/18 2015 12/09/18 0605  NA 140 140  K 2.8* 3.5  CL 108 111  CO2 22 23  GLUCOSE 90 88  BUN 12 9  CREATININE 0.71 0.56  CALCIUM 8.2* 7.7*  MG 2.0  --   AST 15  --   ALT 10  --   ALKPHOS 49  --   BILITOT 0.5  --    RADIOLOGY:  No results found. ASSESSMENT AND PLAN:    1.    C. difficile diarrhea  Patient presented to the emergency room with intractable diarrhea.  C. difficile came back positive.  Patient documented to have had prior antibiotics for UTI. Patient started on p.o. vancomycin 125 mg 4 times daily.  Follow-up on GI panel.   IV fluid hydration.    2.  Hypokalemia.  Replaced.  Follow-up on repeat levels in a.m.  3.    Asymptomatic bacteriuria .   Patient is asymptomatic.  Denied any urinary symptoms or dysuria or frequency.   No indication for IV antibiotics  at this time especially with patient already developing C. difficile diarrhea.  4.  Systemic lupus erythematosus with current generalized body aches concerning for lupus flare.   Patient seen by rheumatologist.  Appreciate input.Sed rate CRP and not elevated.  She has no significant cytopenia.  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  Normal CBC and creatinine Rheumatologist documented that he would be hesitant to give patient high-dose steroids since no clear indication and it may exacerbate recovery of her C. Difficile. I will discontinue IV Solu-Medrol initiated on admission.  Resumed home dose of p.o. prednisone. Requested for antidouble-stranded DNA and C3-C4 as recommended by rheumatologist  5.  Hypertension.  Cozaar will be resumed.  6.  Depression and anxiety.  Celexa and hydroxyzine will be resumed.  7.  Asthma and interstitial lung disease.  Stable We will continue her Qvar MDI as well as as needed albuterol.  8.  DVT prophylaxis.  Subtenons Lovenox.  All the records are reviewed and case discussed with Care Management/Social Worker. Management plans discussed with the patient, and she is in agreement.  CODE STATUS: Full Code  TOTAL TIME TAKING CARE OF THIS PATIENT: 34 minutes.   More than 50% of the time was spent in counseling/coordination of care: YES  POSSIBLE D/C IN 3 DAYS, DEPENDING ON CLINICAL CONDITION.   Jayvien Rowlette M.D on 12/09/2018 at 1:12 PM  Between 7am to 6pm - Pager - [phone]  After 6pm go to [email or web address] - Social research officer, government  [facility]  Office  [phone]  CC: Primary care physician; [name], MD  Note: This dictation was prepared with Dragon dictation along with smaller phrase technology. Any transcriptional errors that result from this process are unintentional.

## 2018-12-09 NOTE — ED Notes (Signed)
Pt ambulated to toilet without assistance, reports body aches 10/10, Dr. Karma Greaser informed

## 2018-12-09 NOTE — H&P (Addendum)
[facility] [place] NAME: Faith Camacho    MR#:  [id number]  DATE OF BIRTH:  [birth year]  DATE OF ADMISSION:  12/08/2018  PRIMARY CARE PHYSICIAN: Virginia Crews, MD   REQUESTING/REFERRING PHYSICIAN: Conni Slipper, MD and Hinda Kehr, MD CHIEF COMPLAINT:   Chief Complaint  Patient presents with  . Diarrhea  . Generalized Body Aches    HISTORY OF PRESENT ILLNESS:  Faith Camacho  is a 28 y.o. African-American female with a known history of systemic lupus erythematosus, chronic kidney disease, hypertension and interstitial lung disease, who presented to the emergency room with acute onset of intractable diarrhea for 5 days for 5 episodes per day and generalized body aches that feels like her lupus flare and worse per her report.  She admitted to mild associated lower abdominal pain.  She denies any melena or bright red bleeding per rectum.  She was seen here on 10/25 and in [facility] on 10/26.  She was given steroid taper and pain therapy that she thought did not help much.  She denies any urinary frequency, urgency, dysuria or hematuria or flank pain.  She has been having diminished appetite.  She denies any loss of taste or smell.  No recent Covid exposure.  Her COVID-19 test came back negative on 10/25 and is currently pending tonight.  She denies any cough or dyspnea.  No chest pain or palpitations.  Upon presentation to the emergency room, vital signs were within normal.  Labs were remarkable for hypokalemia with a potassium of 2.8 compared to 3.1 on 10/25, magnesium of 2 lipase of 36 and CBC was unremarkable.  Sed rate came back 22.  Urine pregnancy test came back negative.  The patient was given; IV potassium chloride and 40 mill: P.o., 1 L bolus of IV normal saline, 4 mg of IV morphine sulfate and 4 mg of IV Zofran as well as 2 g of IV magnesium sulfate.  She will be admitted to medical monitored bed for further evaluation and  management.  PAST MEDICAL HISTORY:   Past Medical History:  Diagnosis Date  . CKD (chronic kidney disease)   . Hypertension   . ILD (interstitial lung disease) ([place])   . Membranous glomerulonephritis   . 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 Asthma  PAST SURGICAL HISTORY:   Past Surgical History:  Procedure Laterality Date  . CHOLECYSTECTOMY    . LUNG BIOPSY    . RENAL BIOPSY      SOCIAL HISTORY:   Social History   Tobacco Use  . Smoking status: Never Smoker  . Smokeless tobacco: Never Used  Substance Use Topics  . Alcohol use: Yes    Frequency: Never    Comment: occas    FAMILY HISTORY:   Family History  Problem Relation Age of Onset  . Hypertension Maternal Grandmother   . Healthy Mother   . Hypertension Father   . Hypertension Maternal Grandfather   . Diabetes Maternal Grandfather   . Hypertension Paternal Grandmother   . Colon cancer Neg Hx   . Breast cancer Neg Hx     DRUG ALLERGIES:   Allergies  Allergen Reactions  . Ibuprofen Other (See Comments)    Can't take because of lupus    REVIEW OF SYSTEMS:   ROS As per history of present illness. All pertinent systems were  reviewed above. Constitutional,  HEENT, cardiovascular, respiratory, GI, GU, musculoskeletal, neuro, psychiatric, endocrine,  integumentary and hematologic systems were reviewed and are otherwise  negative/unremarkable except for positive findings mentioned above in the HPI.   MEDICATIONS AT HOME:   Prior to Admission medications   Medication Sig Start Date End Date Taking? Authorizing Provider  albuterol (PROVENTIL HFA;VENTOLIN HFA) 108 (90 Base) MCG/ACT inhaler Inhale 2 puffs into the lungs every 4 (four) hours as needed for wheezing or shortness of breath. 08/01/17  Yes Cuthriell, Delorise RoyalsJonathan D, PA-C  beclomethasone (QVAR) 80 MCG/ACT inhaler Inhale 2  puffs into the lungs 2 (two) times daily.   Yes [provider]  calcium citrate-vitamin D (CITRACAL+D) 315-200 MG-UNIT tablet Take 1 tablet by mouth 2 (two) times daily.   Yes [provider]  citalopram (CELEXA) 20 MG tablet Take 1 tablet (20 mg total) by mouth daily. 08/05/18  Yes Burnette, Jennifer M, PA-C  Erenumab-aooe (AIMOVIG) 140 MG/ML SOAJ Inject 140 mg into the skin every 30 (thirty) days. 09/16/17  Yes Jaffe, Adam R, DO  gabapentin (NEURONTIN) 300 MG capsule Take 300 mg by mouth 2 (two) times daily.    Yes [provider]  hydroxychloroquine (PLAQUENIL) 200 MG tablet Take 200 mg by mouth 2 (two) times daily.  03/25/18 03/25/19 Yes [provider]  hydrOXYzine (ATARAX/VISTARIL) 10 MG tablet Take 1 tablet (10 mg total) by mouth 3 (three) times daily as needed for anxiety. 03/13/18  Yes Bacigalupo, Marzella SchleinAngela M, MD  losartan (COZAAR) 100 MG tablet Take 100 mg by mouth daily.   Yes [provider]  omeprazole (PRILOSEC) 20 MG capsule Take 20 mg by mouth daily.   Yes [provider]  oxyCODONE-acetaminophen (PERCOCET) 5-325 MG tablet Take 1 tablet by mouth every 4 (four) hours as needed for severe pain. 12/06/18 12/06/19 Yes Jene EveryKinner, Robert, MD  potassium chloride (K-DUR) 10 MEQ tablet Take 10 mEq by mouth daily.   Yes [provider]  predniSONE (DELTASONE) 50 MG tablet Take 1 tablet (50 mg total) by mouth daily with breakfast. 12/06/18  Yes Jene EveryKinner, Robert, MD  sodium bicarbonate 325 MG tablet Take 325 mg by mouth as needed.    Yes [provider]  tacrolimus (PROGRAF) 0.5 MG capsule Take 0.5 mg by mouth 2 (two) times daily.  10/12/18 10/12/19 Yes [provider]  predniSONE (DELTASONE) 5 MG tablet Take 5 mg by mouth daily with breakfast.    [provider]  rizatriptan (MAXALT) 5 MG tablet Take 1 tablet earliest onset of migraine.  May repeat x1 in 2 hours if needed 09/16/17 10/22/18  Drema DallasJaffe, Adam R, DO      VITAL  SIGNS:  Blood pressure 135/84, pulse 60, temperature 98.4 F (36.9 C), temperature source Oral, resp. rate 18, height 4\' 11"  (1.499 m), weight 74.8 kg, last menstrual period 12/06/2018, SpO2 100 %.  PHYSICAL EXAMINATION:  Physical Exam  GENERAL:  28 y.o.-year-old African-American female patient lying in the bed with no acute distress.  EYES: Pupils equal, round, reactive to light and accommodation. No scleral icterus. Extraocular muscles intact.  HEENT: Head atraumatic, normocephalic. Oropharynx and nasopharynx clear.  NECK:  Supple, no jugular venous distention. No thyroid enlargement, no tenderness.  LUNGS: Normal breath sounds bilaterally, no wheezing, rales,rhonchi or crepitation. No use of accessory muscles of respiration.  CARDIOVASCULAR: Regular rate and rhythm, S1, S2 normal. No murmurs, rubs, or gallops.  ABDOMEN: Soft, nondistended, nontender. Bowel sounds present. No organomegaly or mass.  EXTREMITIES: No pedal edema, cyanosis,  or clubbing.  NEUROLOGIC: Cranial nerves II through XII are intact. Muscle strength 5/5 in all extremities. Sensation intact. Gait not checked.  PSYCHIATRIC: The patient is alert and oriented x 3.  Normal affect and good eye contact. SKIN: No obvious rash, lesion, or ulcer.   LABORATORY PANEL:   CBC Recent Labs  Lab 12/08/18 2015  WBC 4.3  HGB 13.3  HCT 41.2  PLT 283   ------------------------------------------------------------------------------------------------------------------  Chemistries  Recent Labs  Lab 12/08/18 2015  NA 140  K 2.8*  CL 108  CO2 22  GLUCOSE 90  Camacho 12  CREATININE 0.71  CALCIUM 8.2*  MG 2.0  AST 15  ALT 10  ALKPHOS 49  BILITOT 0.5   ------------------------------------------------------------------------------------------------------------------  Cardiac Enzymes No results for input(s): TROPONINI in the last 168 hours.  ------------------------------------------------------------------------------------------------------------------  RADIOLOGY:  No results found.    IMPRESSION AND PLAN:   1.  Intractable diarrhea.  The patient will be admitted to a medical monitored bed.  She will be placed on hydration with IV normal saline.  Will follow stool studies including GI pathogen and C. difficile.  I will start her on empiric Flagyl given her current immunosuppressive therapy.  2.  Hypokalemia.  This is clearly secondary to #1.  Potassium will be aggressively replaced.  She was given IV magnesium sulfate in the ER.  3.  Suspected UTI.  Will obtain urine culture and sensitivity and start on IV Rocephin pending urine culture and sensitivity.  This should help as well for possible infectious enteritis given her immunocompromise status.  4.  Systemic lupus erythematosus with current generalized body aches concerning for lupus flare.  We will place her on higher dose of steroids with IV Solu-Medrol.  Her Prograf, CellCept and Plaquenil will be continued.  A rheumatology consultation will be obtained.  I notified Dr. Gavin Potters.  5.  Hypertension.  Cozaar will be resumed.  6.  Depression and anxiety.  Celexa and hydroxyzine will be resumed.  7.  Asthma and interstitial lung disease.  We will continue her Qvar MDI as well as as needed albuterol.  8.  DVT prophylaxis.  Subtenons Lovenox.  All the records are reviewed and case discussed with ED provider. The plan of care was discussed in details with the patient (and family). I answered all questions. The patient agreed to proceed with the above mentioned plan. Further management will depend upon hospital course.   CODE STATUS: Full code  TOTAL TIME TAKING CARE OF THIS PATIENT: 55 minutes.    Hannah Beat M.D on 12/09/2018 at 2:28 AM  Pager - [phone]  After 6pm go to [email or web address] - Social research officer, government  [facility]  Office   [phone]  CC: Primary care physician; [name], MD   Note: This dictation was prepared with Dragon dictation along with smaller phrase technology. Any transcriptional errors that result from this process are unintentional.

## 2018-12-09 NOTE — ED Notes (Signed)
Dr Stark Jock notified of positive cdif.

## 2018-12-10 LAB — BASIC METABOLIC PANEL
Anion gap: 5 (ref 5–15)
BUN: 10 mg/dL (ref 6–20)
CO2: 23 mmol/L (ref 22–32)
Calcium: 7.8 mg/dL — ABNORMAL LOW (ref 8.9–10.3)
Chloride: 113 mmol/L — ABNORMAL HIGH (ref 98–111)
Creatinine, Ser: 0.76 mg/dL (ref 0.44–1.00)
GFR calc Af Amer: >60 mL/min (ref >60)
GFR calc non Af Amer: >60 mL/min (ref >60)
Glucose, Bld: 85 mg/dL (ref 70–99)
Potassium: 4.4 mmol/L (ref 3.5–5.1)
Sodium: 141 mmol/L (ref 135–145)

## 2018-12-10 LAB — CBC
HCT: 37.3 % (ref 36.0–46.0)
Hemoglobin: 12.1 g/dL (ref 12.0–15.0)
MCH: 30.3 pg (ref 26.0–34.0)
MCHC: 32.4 g/dL (ref 30.0–36.0)
MCV: 93.5 fL (ref 80.0–100.0)
Platelets: 243 10*3/uL (ref 150–400)
RBC: 3.99 MIL/uL (ref 3.87–5.11)
RDW: 15.2 % (ref 11.5–15.5)
WBC: 5.1 10*3/uL (ref 4.0–10.5)
nRBC: 0 % (ref 0.0–0.2)

## 2018-12-10 LAB — URINE CULTURE: Culture: <10000 — AB

## 2018-12-10 LAB — MAGNESIUM: Magnesium: 1.9 mg/dL (ref 1.7–2.4)

## 2018-12-10 MED ORDER — MYCOPHENOLATE MOFETIL 250 MG PO CAPS
1500.0000 mg | ORAL_CAPSULE | Freq: Two times a day (BID) | ORAL | Status: DC
  Administered 2018-12-10 – 2018-12-11 (×3): 1500 mg via ORAL
  Administered 2018-12-12: 1250 mg via ORAL
  Filled 2018-12-10 (×6): qty 6

## 2018-12-10 MED ORDER — SODIUM CHLORIDE 0.9 % IV SOLN
INTRAVENOUS | Status: DC
  Administered 2018-12-10 – 2018-12-12 (×5): via INTRAVENOUS

## 2018-12-10 NOTE — Progress Notes (Signed)
[facility] [place] NAME: Faith Camacho    MR#:  [id number]  DATE OF BIRTH:  11/11/1990  SUBJECTIVE:  CHIEF COMPLAINT:   Chief Complaint  Patient presents with  . Diarrhea  . Generalized Body Aches   No new complaint this morning.  Patient presented with diarrhea.  Notified that C. difficile came back positive.  Patient started on p.o. vancomycin.  No fevers.  REVIEW OF SYSTEMS:  Review of Systems  Constitutional: Negative for chills and fever.  HENT: Negative for hearing loss and tinnitus.   Eyes: Negative for blurred vision.  Respiratory: Negative for cough and shortness of breath.   Cardiovascular: Negative for chest pain and palpitations.  Gastrointestinal: Negative for heartburn, nausea and vomiting.       Diarrhea improving.  Stool getting more soft  Genitourinary: Negative for dysuria and urgency.  Musculoskeletal: Positive for myalgias. Negative for back pain.  Skin: Negative for itching and rash.  Neurological: Negative for dizziness and headaches.  Psychiatric/Behavioral: Negative for depression and hallucinations.    DRUG ALLERGIES:   Allergies  Allergen Reactions  . Ibuprofen Other (See Comments)    Can't take because of lupus   VITALS:  Blood pressure (!) 137/106, pulse 73, temperature 98.2 F (36.8 C), temperature source Oral, resp. rate 18, height 4\' 11"  (1.499 m), weight 74.8 kg, last menstrual period 12/06/2018, SpO2 98 %. PHYSICAL EXAMINATION:  Physical Exam  GENERAL:  28 y.o.-year-old African-American female patient lying in the bed with no acute distress.  EYES: Pupils equal, round, reactive to light and accommodation. No scleral icterus. Extraocular muscles intact.  HEENT: Head atraumatic, normocephalic. Oropharynx and nasopharynx clear.  NECK:  Supple, no jugular venous distention. No thyroid enlargement, no tenderness.  LUNGS: Normal breath sounds bilaterally, no wheezing, rales,rhonchi or  crepitation. No use of accessory muscles of respiration.  CARDIOVASCULAR: Regular rate and rhythm, S1, S2 normal. No murmurs, rubs, or gallops.  ABDOMEN: Soft, nondistended, nontender. Bowel sounds present. No organomegaly or mass.  EXTREMITIES: No pedal edema, cyanosis, or clubbing.  NEUROLOGIC: Cranial nerves II through XII are intact. Muscle strength 5/5 in all extremities. Sensation intact. Gait not checked.  PSYCHIATRIC: The patient is alert and oriented x 3.  Normal affect and good eye contact. SKIN: No obvious rash, lesion, or ulcer.   LABORATORY PANEL:  Female CBC Recent Labs  Lab 12/10/18 0544  WBC 5.1  HGB 12.1  HCT 37.3  PLT 243   ------------------------------------------------------------------------------------------------------------------ Chemistries  Recent Labs  Lab 12/08/18 2015  12/10/18 0544  NA 140   < > 141  K 2.8*   < > 4.4  CL 108   < > 113*  CO2 22   < > 23  GLUCOSE 90   < > 85  BUN 12   < > 10  CREATININE 0.71   < > 0.76  CALCIUM 8.2*   < > 7.8*  MG 2.0  --  1.9  AST 15  --   --   ALT 10  --   --   ALKPHOS 49  --   --   BILITOT 0.5  --   --    < > = values in this interval not displayed.   RADIOLOGY:  No results found. ASSESSMENT AND PLAN:    1.    C. difficile diarrhea  Patient presented to the emergency room with intractable diarrhea.  C. difficile came back positive.  Patient documented to have had prior  antibiotics for UTI. Patient started on p.o. vancomycin 125 mg 4 times daily.  Follow-up on GI panel.   IV fluid hydration.   Stool getting more formed this morning.  2.  Hypokalemia.  Replaced.    3.    Asymptomatic bacteriuria .   Patient is asymptomatic.  Denied any urinary symptoms or dysuria or frequency.   No indication for IV antibiotics at this time especially with patient already developing C. difficile diarrhea.  4.  Systemic lupus erythematosus with current generalized body aches concerning for lupus flare.   Patient  seen by rheumatologist.  Appreciate input.Sed rate CRP and not elevated.  She has no significant cytopenia.  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  Normal CBC and creatinine Rheumatologist documented that he would be hesitant to give patient high-dose steroids since no clear indication and it may exacerbate recovery of her C. Difficile. Previously discontinued IV Solu-Medrol initiated on admission.  Resumed home dose of p.o. prednisone. Requested for antidouble-stranded DNA and C3-C4 as recommended by rheumatologist  5.  Hypertension.  Cozaar will be resumed.  6.  Depression and anxiety.  Celexa and hydroxyzine will be resumed.  7.  Asthma and interstitial lung disease.  Stable     DVT prophylaxis.  Subtenons Lovenox.  All the records are reviewed and case discussed with Care Management/Social Worker. Management plans discussed with the patient, and she is in agreement.  CODE STATUS: Full Code  TOTAL TIME TAKING CARE OF THIS PATIENT: .   More than 50% of the time was spent in counseling/coordination of care: YES  POSSIBLE D/C IN 2 DAYS, DEPENDING ON CLINICAL CONDITION.   Khaleef Ruby M.D on 12/10/2018 at 2:56 PM  Between 7am to 6pm - Pager - [phone]  After 6pm go to [email or web address] - Social research officer, government  [facility]  Office  [phone]  CC: Primary care physician; [name], MD  Note: This dictation was prepared with Dragon dictation along with smaller phrase technology. Any transcriptional errors that result from this process are unintentional.
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
## 2018-12-11 LAB — BASIC METABOLIC PANEL
Anion gap: 8 (ref 5–15)
BUN: 13 mg/dL (ref 6–20)
CO2: 22 mmol/L (ref 22–32)
Calcium: 8.6 mg/dL — ABNORMAL LOW (ref 8.9–10.3)
Chloride: 109 mmol/L (ref 98–111)
Creatinine, Ser: 0.59 mg/dL (ref 0.44–1.00)
GFR calc Af Amer: >60 mL/min (ref >60)
GFR calc non Af Amer: >60 mL/min (ref >60)
Glucose, Bld: 87 mg/dL (ref 70–99)
Potassium: 4.2 mmol/L (ref 3.5–5.1)
Sodium: 139 mmol/L (ref 135–145)

## 2018-12-11 LAB — GI PATHOGEN PANEL BY PCR, STOOL

## 2018-12-11 LAB — MAGNESIUM: Magnesium: 1.8 mg/dL (ref 1.7–2.4)

## 2018-12-11 MED ORDER — PREDNISONE 5 MG PO TABS
5.0000 mg | ORAL_TABLET | Freq: Every day | ORAL | Status: DC
  Administered 2018-12-12: 5 mg via ORAL
  Filled 2018-12-11: qty 1

## 2018-12-11 NOTE — Progress Notes (Signed)
[facility] [place] NAME: Faith Camacho    MR#:  [id number]  DATE OF BIRTH:  03/18/1990  SUBJECTIVE:  CHIEF COMPLAINT:   Chief Complaint  Patient presents with  . Diarrhea  . Generalized Body Aches   No new complaint this morning.  Patient presented with diarrhea.  Notified that C. difficile came back positive.  Patient started on p.o. vancomycin.  No fevers.  Diarrhea improving.  REVIEW OF SYSTEMS:  Review of Systems  Constitutional: Negative for chills and fever.  HENT: Negative for hearing loss and tinnitus.   Eyes: Negative for blurred vision.  Respiratory: Negative for cough and shortness of breath.   Cardiovascular: Negative for chest pain and palpitations.  Gastrointestinal: Negative for heartburn, nausea and vomiting.       Diarrhea improving.  Stool getting more soft  Genitourinary: Negative for dysuria and urgency.  Musculoskeletal: Negative for back pain and myalgias.  Skin: Negative for itching and rash.  Neurological: Negative for dizziness and headaches.  Psychiatric/Behavioral: Negative for depression and hallucinations.    DRUG ALLERGIES:   Allergies  Allergen Reactions  . Ibuprofen Other (See Comments)    Can't take because of lupus   VITALS:  Blood pressure 138/83, pulse (!) 59, temperature 98.5 F (36.9 C), temperature source Oral, resp. rate 17, height 4\' 11"  (1.499 m), weight 74.8 kg, last menstrual period 12/06/2018, SpO2 100 %. PHYSICAL EXAMINATION:  Physical Exam  GENERAL:  28 y.o.-year-old African-American female patient lying in the bed with no acute distress.  EYES: Pupils equal, round, reactive to light and accommodation. No scleral icterus. Extraocular muscles intact.  HEENT: Head atraumatic, normocephalic. Oropharynx and nasopharynx clear.  NECK:  Supple, no jugular venous distention. No thyroid enlargement, no tenderness.  LUNGS: Normal breath sounds bilaterally, no wheezing, rales,rhonchi or  crepitation. No use of accessory muscles of respiration.  CARDIOVASCULAR: Regular rate and rhythm, S1, S2 normal. No murmurs, rubs, or gallops.  ABDOMEN: Soft, nondistended, nontender. Bowel sounds present. No organomegaly or mass.  EXTREMITIES: No pedal edema, cyanosis, or clubbing.  NEUROLOGIC: Cranial nerves II through XII are intact. Muscle strength 5/5 in all extremities. Sensation intact. Gait not checked.  PSYCHIATRIC: The patient is alert and oriented x 3.  Normal affect and good eye contact. SKIN: No obvious rash, lesion, or ulcer.   LABORATORY PANEL:  Female CBC Recent Labs  Lab 12/10/18 0544  WBC 5.1  HGB 12.1  HCT 37.3  PLT 243   ------------------------------------------------------------------------------------------------------------------ Chemistries  Recent Labs  Lab 12/08/18 2015  12/11/18 0430  NA 140   < > 139  K 2.8*   < > 4.2  CL 108   < > 109  CO2 22   < > 22  GLUCOSE 90   < > 87  BUN 12   < > 13  CREATININE 0.71   < > 0.59  CALCIUM 8.2*   < > 8.6*  MG 2.0   < > 1.8  AST 15  --   --   ALT 10  --   --   ALKPHOS 49  --   --   BILITOT 0.5  --   --    < > = values in this interval not displayed.   RADIOLOGY:  No results found. ASSESSMENT AND PLAN:    1.    C. difficile diarrhea  Patient presented to the emergency room with intractable diarrhea.  C. difficile came back positive.  Patient documented to  have had prior antibiotics for UTI. Patient started on p.o. vancomycin 125 mg 4 times daily.  GI panel negative IV fluid hydration.   Diarrhea improving.  Anticipate possible discharge in 1 to 2 days  2.  Hypokalemia.  Replaced.    3.    Asymptomatic bacteriuria .   Patient is asymptomatic.  Denied any urinary symptoms or dysuria or frequency.   No indication for IV antibiotics at this time especially with patient already developing C. difficile diarrhea.  4.  Systemic lupus erythematosus; no evidence of flareup Patient seen by  rheumatologist.  Appreciate input.Sed rate CRP and not elevated.  She has no significant cytopenia.  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  Normal CBC and creatinine Appreciate input from rheumatologist.  IV Solu-Medrol previously discontinued.  Rheumatologist recommended patient resume her chronic prednisone at 5 mg daily and to follow-up with her rheumatologist at [facility] post discharge from the hospital  Requested for antidouble-stranded DNA and C3-C4 as recommended by rheumatologist.  Her rheumatologist can follow-up on results when available  5.  Hypertension.  Cozaar will be resumed.  6.  Depression and anxiety.  Celexa and hydroxyzine will be resumed.  7.  Asthma and interstitial lung disease.  Stable     DVT prophylaxis.  Subtenons Lovenox.  All the records are reviewed and case discussed with Care Management/Social Worker. Management plans discussed with the patient, and she is in agreement.  CODE STATUS: Full Code  TOTAL TIME TAKING CARE OF THIS PATIENT: .   More than 50% of the time was spent in counseling/coordination of care: YES  POSSIBLE D/C IN 2 DAYS, DEPENDING ON CLINICAL CONDITION.   Chun Sellen M.D on 12/11/2018 at 1:02 PM  Between 7am to 6pm - Pager - [phone]  After 6pm go to [email or web address] - Social research officer, government  [facility]  Office  [phone]  CC: Primary care physician; [name], MD  Note: This dictation was prepared with Dragon dictation along with smaller phrase technology. Any transcriptional errors that result from this process are unintentional.

## 2018-12-11 NOTE — Consult Note (Signed)
Rheumatology follow-up Less diarrhea.  Less abdominal pain.  Eating.  No fever.  Complains of generalized pain to the nurses but not to me.  No complaints of swollen joints or rash.  PE Vital signs stable.  No rash.  Sclera clear.  Clear chest.  No murmur.  Mildly tender abdomen but bowel sounds present.  No significant edema.  No synovitis.  Impressions: No evidence of active lupus flare. Normal creatinine, CBC, minimally elevated sed rate CRP. 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 Difficile  Plan: Continue chronic lupus meds.  May go back to her chronic dose of prednisone at 5 mg May follow-up with her [facility] rheumatologist upon discharge

## 2018-12-12 LAB — BASIC METABOLIC PANEL
Anion gap: 6 (ref 5–15)
BUN: 15 mg/dL (ref 6–20)
CO2: 27 mmol/L (ref 22–32)
Calcium: 9.1 mg/dL (ref 8.9–10.3)
Chloride: 107 mmol/L (ref 98–111)
Creatinine, Ser: 0.68 mg/dL (ref 0.44–1.00)
GFR calc Af Amer: >60 mL/min (ref >60)
GFR calc non Af Amer: >60 mL/min (ref >60)
Glucose, Bld: 84 mg/dL (ref 70–99)
Potassium: 3.6 mmol/L (ref 3.5–5.1)
Sodium: 140 mmol/L (ref 135–145)

## 2018-12-12 LAB — MAGNESIUM: Magnesium: 1.5 mg/dL — ABNORMAL LOW (ref 1.7–2.4)

## 2018-12-12 MED ORDER — OXYCODONE-ACETAMINOPHEN 5-325 MG PO TABS: 1.0000 | ORAL_TABLET | ORAL | 0 refills | Status: DC | PRN

## 2018-12-12 MED ORDER — VANCOMYCIN HCL 125 MG PO CAPS: 125.0000 mg | ORAL_CAPSULE | Freq: Four times a day (QID) | ORAL | 0 refills | Status: DC

## 2018-12-12 MED ORDER — VANCOMYCIN 50 MG/ML ORAL SOLUTION: 125.0000 mg | Freq: Four times a day (QID) | ORAL | 0 refills | Status: DC

## 2018-12-12 MED ORDER — MAGNESIUM SULFATE 2 GM/50ML IV SOLN
2.0000 g | Freq: Once | INTRAVENOUS | Status: AC
  Administered 2018-12-12: 2 g via INTRAVENOUS
  Filled 2018-12-12: qty 50

## 2018-12-12 NOTE — Progress Notes (Signed)
Faith Camacho  A and O x 4 VSS. Pt tolerating diet well. No complaints of pain or nausea. IV removed intact, prescriptions given. Pt voices understanding of discharge instructions with no further questions. Pt discharged via wheelchair with axillary.   Allergies as of 12/12/2018      Reactions   Ibuprofen Other (See Comments)   Can't take because of lupus      Medication List    TAKE these medications   albuterol 108 (90 Base) MCG/ACT inhaler Commonly known as: VENTOLIN HFA Inhale 2 puffs into the lungs every 4 (four) hours as needed for wheezing or shortness of breath.   beclomethasone 80 MCG/ACT inhaler Commonly known as: QVAR Inhale 2 puffs into the lungs 2 (two) times daily.   calcium citrate-vitamin D 315-200 MG-UNIT tablet Commonly known as: CITRACAL+D Take 1 tablet by mouth 2 (two) times daily.   citalopram 20 MG tablet Commonly known as: CELEXA Take 1 tablet (20 mg total) by mouth daily.   Erenumab-aooe 140 MG/ML Soaj Commonly known as: Aimovig Inject 140 mg into the skin every 30 (thirty) days.   gabapentin 300 MG capsule Commonly known as: NEURONTIN Take 300 mg by mouth 2 (two) times daily.   hydroxychloroquine 200 MG tablet Commonly known as: PLAQUENIL Take 200 mg by mouth 2 (two) times daily.   hydrOXYzine 10 MG tablet Commonly known as: ATARAX/VISTARIL Take 1 tablet (10 mg total) by mouth 3 (three) times daily as needed for anxiety.   losartan 100 MG tablet Commonly known as: COZAAR Take 100 mg by mouth daily.   mycophenolate 500 MG tablet Commonly known as: CELLCEPT Take 1,500 mg by mouth 2 (two) times daily.   omeprazole 20 MG capsule Commonly known as: PRILOSEC Take 20 mg by mouth daily.   oxyCODONE-acetaminophen 5-325 MG tablet Commonly known as: Percocet Take 1 tablet by mouth every 4 (four) hours as needed for severe pain.   potassium chloride 10 MEQ tablet Commonly known as: KLOR-CON Take 10 mEq by mouth daily.   predniSONE 5 MG  tablet Commonly known as: DELTASONE Take 5 mg by mouth daily with breakfast. What changed: Another medication with the same name was removed. Continue taking this medication, and follow the directions you see here.   sodium bicarbonate 325 MG tablet Take 325 mg by mouth as needed.   tacrolimus 0.5 MG capsule Commonly known as: PROGRAF Take 0.5 mg by mouth 2 (two) times daily.   vancomycin 125 MG capsule Commonly known as: Vancocin HCl Take 1 capsule (125 mg total) by mouth 4 (four) times daily for 7 days.       Vitals:   12/11/18 2145 12/12/18 0530  BP: 129/77 (!) 134/92  Pulse: 69 66  Resp: 20 16  Temp: 98.3 F (36.8 C) 98 F (36.7 C)  SpO2: 100% 100%    Faith Camacho Faith Camacho

## 2018-12-12 NOTE — Progress Notes (Signed)
  Advanced care plan.  Purpose of the Encounter: CODE STATUS  Parties in Attendance:Patient  Patient's Decision Capacity:Good  Subjective/Patient's story: Faith Camacho  is a 28 y.o. African-American female with a known history of systemic lupus erythematosus, chronic kidney disease, hypertension and interstitial lung disease, who presented to the emergency room with acute onset of intractable diarrhea for 5 days for 5 episodes per day and generalized body aches that feels like her lupus flare and worse per her report.  She admitted to mild associated lower abdominal pain.  She denies any melena or bright red bleeding per rectum.  She was seen here on 10/25 and in [place] on 10/26.  She was given steroid taper and pain therapy that she thought did not help much.  She denies any urinary frequency, urgency, dysuria or hematuria or flank pain.  She has been having diminished appetite.  She denies any loss of taste or smell.  No recent Covid exposure.  Her COVID-19 test came back negative on 10/25 and is currently pending tonight.  She denies any cough or dyspnea.  No chest pain or palpitations. Upon presentation to the emergency room, vital signs were within normal.  Labs were remarkable for hypokalemia with a potassium of 2.8 compared to 3.1 on 10/25, magnesium of 2 lipase of 36 and CBC was unremarkable.  Sed rate came back 22.  Urine pregnancy test came back negative. The patient was given; IV potassium chloride and 40 mill: P.o., 1 L bolus of IV normal saline, 4 mg of IV morphine sulfate and 4 mg of IV Zofran as well as 2 g of IV magnesium sulfate.  She will be admitted to medical monitored bed for further evaluation and management.  Objective/Medical story Patient needs IV fluids Stool work up for clostridium difficile toxin Needs vancomycin abx  Goals of care determination:  Advance care directives and goals of care discussed Patient wants everything done which includes cpr, intubation and  ventilator if need arises   CODE STATUS: Full code  Time spent discussing advanced care planning: 16 minutes

## 2018-12-12 NOTE — Discharge Summary (Signed)
[facility] [place] NAME: Faith Camacho    MR#:  [id number]  DATE OF BIRTH:  Aug 07, 1990  DATE OF ADMISSION:  12/08/2018 ADMITTING PHYSICIAN: Christel Mormon, MD  DATE OF DISCHARGE: 12/12/2018  PRIMARY CARE PHYSICIAN: Virginia Crews, MD   ADMISSION DIAGNOSIS:  Lupus ([place]) [M32.9] Diarrhea, unspecified type [R19.7] Generalized body aches [R52] Hypokalemia due to excessive gastrointestinal loss of potassium [E87.6]  DISCHARGE DIAGNOSIS:  Active Problems:   Intractable diarrhea C. difficile colitis Hypokalemia History of systemic lupus erythematosus Acute hypomagnesemia SECONDARY DIAGNOSIS:   Past Medical History:  Diagnosis Date  . CKD (chronic kidney disease)   . Hypertension   . ILD (interstitial lung disease) ([place])   . Membranous glomerulonephritis   . SLE (systemic lupus erythematosus related syndrome) ([place])      ADMITTING HISTORY Faith Camacho  is a 28 y.o. African-American female with a known history of systemic lupus erythematosus, chronic kidney disease, hypertension and interstitial lung disease, who presented to the emergency room with acute onset of intractable diarrhea for 5 days for 5 episodes per day and generalized body aches that feels like her lupus flare and worse per her report.  She admitted to mild associated lower abdominal pain.  She denies any melena or bright red bleeding per rectum.  She was seen here on 10/25 and in [place] on 10/26.  She was given steroid taper and pain therapy that she thought did not help much.  She denies any urinary frequency, urgency, dysuria or hematuria or flank pain.  She has been having diminished appetite.  She denies any loss of taste or smell.  No recent Covid exposure.  Her COVID-19 test came back negative on 10/25 and is currently pending tonight.  She denies any cough or dyspnea.  No chest pain or palpitations. Upon presentation to the emergency room, vital signs were within  normal.  Labs were remarkable for hypokalemia with a potassium of 2.8 compared to 3.1 on 10/25, magnesium of 2 lipase of 36 and CBC was unremarkable.  Sed rate came back 22.  Urine pregnancy test came back negative. The patient was given; IV potassium chloride and 40 mill: P.o., 1 L bolus of IV normal saline, 4 mg of IV morphine sulfate and 4 mg of IV Zofran as well as 2 g of IV magnesium sulfate.  She will be admitted to medical monitored bed for further evaluation and management  HOSPITAL COURSE:  Patient was admitted to medical floor she received IV fluids and potassium supplementation.  Stool work-up was done and C. difficile toxin was positive patient started on vancomycin antibiotic orally.  Initially patient had a lot of diarrhea.  Her diarrhea improved she had more formed stools with oral vancomycin antibiotic.  She was seen by rheumatology attending for lupus erythematosus.  Patient follows up with rheumatology in the clinic as outpatient.  No active flareup.  Magnesium was also low and was supplemented during hospitalization.  Urine culture insignificant growth.  Patient has formed stool diarrhea is resolved will be discharged home.  CONSULTS OBTAINED:  Rheumatology consult  DRUG ALLERGIES:   Allergies  Allergen Reactions  . Ibuprofen Other (See Comments)    Can't take because of lupus    DISCHARGE MEDICATIONS:   Allergies as of 12/12/2018      Reactions   Ibuprofen Other (See Comments)   Can't take because of lupus      Medication List    TAKE these medications  albuterol 108 (90 Base) MCG/ACT inhaler Commonly known as: VENTOLIN HFA Inhale 2 puffs into the lungs every 4 (four) hours as needed for wheezing or shortness of breath.   beclomethasone 80 MCG/ACT inhaler Commonly known as: QVAR Inhale 2 puffs into the lungs 2 (two) times daily.   calcium citrate-vitamin D 315-200 MG-UNIT tablet Commonly known as: CITRACAL+D Take 1 tablet by mouth 2 (two) times daily.    citalopram 20 MG tablet Commonly known as: CELEXA Take 1 tablet (20 mg total) by mouth daily.   Erenumab-aooe 140 MG/ML Soaj Commonly known as: Aimovig Inject 140 mg into the skin every 30 (thirty) days.   gabapentin 300 MG capsule Commonly known as: NEURONTIN Take 300 mg by mouth 2 (two) times daily.   hydroxychloroquine 200 MG tablet Commonly known as: PLAQUENIL Take 200 mg by mouth 2 (two) times daily.   hydrOXYzine 10 MG tablet Commonly known as: ATARAX/VISTARIL Take 1 tablet (10 mg total) by mouth 3 (three) times daily as needed for anxiety.   losartan 100 MG tablet Commonly known as: COZAAR Take 100 mg by mouth daily.   mycophenolate 500 MG tablet Commonly known as: CELLCEPT Take 1,500 mg by mouth 2 (two) times daily.   omeprazole 20 MG capsule Commonly known as: PRILOSEC Take 20 mg by mouth daily.   oxyCODONE-acetaminophen 5-325 MG tablet Commonly known as: Percocet Take 1 tablet by mouth every 4 (four) hours as needed for severe pain.   potassium chloride 10 MEQ tablet Commonly known as: KLOR-CON Take 10 mEq by mouth daily.   predniSONE 5 MG tablet Commonly known as: DELTASONE Take 5 mg by mouth daily with breakfast. What changed: Another medication with the same name was removed. Continue taking this medication, and follow the directions you see here.   sodium bicarbonate 325 MG tablet Take 325 mg by mouth as needed.   tacrolimus 0.5 MG capsule Commonly known as: PROGRAF Take 0.5 mg by mouth 2 (two) times daily.   vancomycin 125 MG capsule Commonly known as: Vancocin HCl Take 1 capsule (125 mg total) by mouth 4 (four) times daily for 7 days.       Today  Patient seen today Tolerating diet well No abdominal pain Diarrhea resolved No fever Hemodynamically stable VITAL SIGNS:  Blood pressure (!) 134/92, pulse 66, temperature 98 F (36.7 C), temperature source Oral, resp. rate 16, height  (1.499 m), weight 74.8 kg, last menstrual period  12/06/2018, SpO2 100 %.  I/O:    Intake/Output Summary (Last 24 hours) at 12/12/2018 1123 Last data filed at 12/12/2018 0105 Gross per 24 hour  Intake 959.44 ml  Output 1700 ml  Net -740.56 ml    PHYSICAL EXAMINATION:  Physical Exam  GENERAL:  28 y.o.-year-old patient lying in the bed with no acute distress.  LUNGS: Normal breath sounds bilaterally, no wheezing, rales,rhonchi or crepitation. No use of accessory muscles of respiration.  CARDIOVASCULAR: S1, S2 normal. No murmurs, rubs, or gallops.  ABDOMEN: Soft, non-tender, non-distended. Bowel sounds present. No organomegaly or mass.  NEUROLOGIC: Moves all 4 extremities. PSYCHIATRIC: The patient is alert and oriented x 3.  SKIN: No obvious rash, lesion, or ulcer.   DATA REVIEW:   CBC Recent Labs  Lab 12/10/18 0544  WBC 5.1  HGB 12.1  HCT 37.3  PLT 243    Chemistries  Recent Labs  Lab 12/08/18 2015  12/12/18 0736  NA 140   < > 140  K 2.8*   < > 3.6  CL  108   < > 107  CO2 22   < > 27  GLUCOSE 90   < > 84  BUN 12   < > 15  CREATININE 0.71   < > 0.68  CALCIUM 8.2*   < > 9.1  MG 2.0   < > 1.5*  AST 15  --   --   ALT 10  --   --   ALKPHOS 49  --   --   BILITOT 0.5  --   --    < > = values in this interval not displayed.    Cardiac Enzymes No results for input(s): TROPONINI in the last 168 hours.  Microbiology Results  Results for orders placed or performed during the hospital encounter of 12/08/18  Urine Culture     Status: Abnormal   Collection Time: 12/08/18  8:15 PM   Specimen: Urine, Random  Result Value Ref Range Status   Specimen Description   Final    URINE, RANDOM Performed at [facility], [address], [place], [place] [address]    Special Requests   Final    NONE Performed at [facility], [address] [place], [place] [address]    Culture (A)  Final    <10,000 COLONIES/mL INSIGNIFICANT GROWTH Performed at [facility], [address] [place], [place]  [address]    Report Status 12/10/2018 FINAL  Final  GI pathogen panel by PCR, stool     Status: None   Collection Time: 12/08/18 11:15 PM   Specimen: STOOL  Result Value Ref Range Status   Plesiomonas shigelloides NOT DETECTED NOT DETECTED Final   Yersinia enterocolitica NOT DETECTED NOT DETECTED Final   Vibrio NOT DETECTED NOT DETECTED Final   Enteropathogenic E coli NOT DETECTED NOT DETECTED Final   E coli (ETEC) LT/ST NOT DETECTED NOT DETECTED Final   E coli 0157 by PCR Not applicable NOT DETECTED Final   Cryptosporidium by PCR NOT DETECTED NOT DETECTED Final   Entamoeba histolytica NOT DETECTED NOT DETECTED Final   Adenovirus F 40/41 NOT DETECTED NOT DETECTED Final   Norovirus GI/GII NOT DETECTED NOT DETECTED Final   Sapovirus NOT DETECTED NOT DETECTED Final    Comment: (NOTE) Performed At: [facility] [address] [place], [place] [id number] Jolene Schimke MD XN:[id number]    Vibrio cholerae NOT DETECTED NOT DETECTED Final   Campylobacter by PCR NOT DETECTED NOT DETECTED Final   Salmonella by PCR NOT DETECTED NOT DETECTED Final   E coli (STEC) NOT DETECTED NOT DETECTED Final   Enteroaggregative E coli NOT DETECTED NOT DETECTED Final   Shigella by PCR NOT DETECTED NOT DETECTED Final   Cyclospora cayetanensis NOT DETECTED NOT DETECTED Final   Astrovirus NOT DETECTED NOT DETECTED Final   G lamblia by PCR NOT DETECTED NOT DETECTED Final   Rotavirus A by PCR NOT DETECTED NOT DETECTED Final  C difficile quick scan w PCR reflex     Status: Abnormal   Collection Time: 12/08/18 11:15 PM   Specimen: STOOL  Result Value Ref Range Status   C Diff antigen POSITIVE (A) NEGATIVE Final   C Diff toxin POSITIVE (A) NEGATIVE Final   C Diff interpretation Toxin producing C. difficile detected.  Final    Comment: CRITICAL RESULT CALLED TO, READ BACK BY AND VERIFIED WITH: Marylee Floras 12/09/18 @ 1249  MLK Performed at [facility], [address] [place], [place]  [address]   SARS CORONAVIRUS 2 (TAT  6-24 HRS) Nasopharyngeal Nasopharyngeal Swab     Status: None   Collection Time: 12/09/18  1:15 AM   Specimen: Nasopharyngeal Swab  Result Value Ref Range Status   SARS Coronavirus 2 NEGATIVE NEGATIVE Final    Comment: (NOTE) SARS-CoV-2 target nucleic acids are NOT DETECTED. The SARS-CoV-2 RNA is generally detectable in upper and lower respiratory specimens during the acute phase of infection. Negative results do not preclude SARS-CoV-2 infection, do not rule out co-infections with other pathogens, and should not be used as the sole basis for treatment or other patient management decisions. Negative results must be combined with clinical observations, patient history, and epidemiological information. The expected result is Negative. Fact Sheet for Patients: HairSlick.nohttps://www.fda.gov/media/138098/download Fact Sheet for Healthcare Providers: quierodirigir.comhttps://www.fda.gov/media/138095/download This test is not yet approved or cleared by the [place] FDA and  has been authorized for detection and/or diagnosis of SARS-CoV-2 by FDA under an Emergency Use Authorization (EUA). This EUA will remain  in effect (meaning this test can be used) for the duration of the COVID-19 declaration under Section 56 4(b)(1) of the Act, 21 U.S.C. section 360bbb-3(b)(1), unless the authorization is terminated or revoked sooner. Performed at [facility], [address] [place], [place] [address]     RADIOLOGY:  No results found.  Follow up with PCP in 1 week.  Management plans discussed with the patient, family and they are in agreement.  CODE STATUS: Full code    Code Status Orders  (From admission, onward)         Start     Ordered   12/09/18 0220  Full code  Continuous     12/09/18 0224        Code Status History    This patient has a current code status but no historical code status.   Advance Care Planning Activity      TOTAL TIME TAKING CARE OF  THIS PATIENT ON DAY OF DISCHARGE: more than 35 minutes.   Ihor AustinPavan Pyreddy M.D on 12/12/2018 at 11:23 AM  Between 7am to 6pm - Pager - [phone]  After 6pm go to [email or web address] - password [email or web address] [facility]  [facility] [place] [facility]  Office  [phone]  CC: Primary care physician; Erasmo DownerBacigalupo, Angela M, MD  Note: This dictation was prepared with Dragon dictation along with smaller phrase technology. Any transcriptional errors that result from this process are unintentional.

## 2018-12-12 NOTE — Progress Notes (Signed)
[place] at [place] was admitted to the [facility] on 12/08/2018 and Discharged  12/12/2018 and should be excused from work/school   For 8   days starting 12/08/2018 , may return to work/school without any restrictions.  Call Dustin Flock MD with questions.  Dustin Flock M.D on 12/12/2018,at 3:28 PM  [place] at [facility]  [phone]

## 2018-12-13 LAB — C3 COMPLEMENT: C3 Complement: 114 mg/dL (ref 82–167)

## 2018-12-13 LAB — C4 COMPLEMENT: Complement C4, Body Fluid: 35 mg/dL (ref 12–38)

## 2018-12-14 ENCOUNTER — Other Ambulatory Visit: Payer: Self-pay

## 2018-12-14 ENCOUNTER — Emergency Department
Admission: EM | Admit: 2018-12-14 | Discharge: 2018-12-14 | Disposition: A | Discharge status: Home / Self Care | Payer: Medicaid Other | Attending: Emergency Medicine | Admitting: Emergency Medicine

## 2018-12-14 ENCOUNTER — Encounter: Payer: Self-pay | Admitting: Emergency Medicine

## 2018-12-14 ENCOUNTER — Telehealth: Payer: Self-pay

## 2018-12-14 DIAGNOSIS — M3210 Systemic lupus erythematosus, organ or system involvement unspecified: Secondary | ICD-10-CM | POA: Diagnosis not present

## 2018-12-14 DIAGNOSIS — I129 Hypertensive chronic kidney disease with stage 1 through stage 4 chronic kidney disease, or unspecified chronic kidney disease: Secondary | ICD-10-CM | POA: Diagnosis not present

## 2018-12-14 DIAGNOSIS — E860 Dehydration: Secondary | ICD-10-CM | POA: Diagnosis not present

## 2018-12-14 DIAGNOSIS — A0472 Enterocolitis due to Clostridium difficile, not specified as recurrent: Secondary | ICD-10-CM | POA: Diagnosis not present

## 2018-12-14 DIAGNOSIS — Z79899 Other long term (current) drug therapy: Secondary | ICD-10-CM | POA: Diagnosis not present

## 2018-12-14 DIAGNOSIS — E785 Hyperlipidemia, unspecified: Secondary | ICD-10-CM | POA: Insufficient documentation

## 2018-12-14 DIAGNOSIS — R42 Dizziness and giddiness: Secondary | ICD-10-CM | POA: Diagnosis present

## 2018-12-14 DIAGNOSIS — N182 Chronic kidney disease, stage 2 (mild): Secondary | ICD-10-CM | POA: Diagnosis not present

## 2018-12-14 DIAGNOSIS — Z886 Allergy status to analgesic agent status: Secondary | ICD-10-CM | POA: Insufficient documentation

## 2018-12-14 DIAGNOSIS — Z7952 Long term (current) use of systemic steroids: Secondary | ICD-10-CM | POA: Insufficient documentation

## 2018-12-14 LAB — CBC WITH DIFFERENTIAL/PLATELET
Abs Immature Granulocytes: 0.1 10*3/uL — ABNORMAL HIGH (ref 0.00–0.07)
Basophils Absolute: 0 10*3/uL (ref 0.0–0.1)
Basophils Relative: 1 %
Eosinophils Absolute: 0 10*3/uL (ref 0.0–0.5)
Eosinophils Relative: 1 %
HCT: 42.3 % (ref 36.0–46.0)
Hemoglobin: 13.5 g/dL (ref 12.0–15.0)
Immature Granulocytes: 3 %
Lymphocytes Relative: 24 %
Lymphs Abs: 0.9 10*3/uL (ref 0.7–4.0)
MCH: 30.1 pg (ref 26.0–34.0)
MCHC: 31.9 g/dL (ref 30.0–36.0)
MCV: 94.4 fL (ref 80.0–100.0)
Monocytes Absolute: 0.5 10*3/uL (ref 0.1–1.0)
Monocytes Relative: 13 %
Neutro Abs: 2.2 10*3/uL (ref 1.7–7.7)
Neutrophils Relative %: 58 %
Platelets: 274 10*3/uL (ref 150–400)
RBC: 4.48 MIL/uL (ref 3.87–5.11)
RDW: 15.3 % (ref 11.5–15.5)
WBC: 3.8 10*3/uL — ABNORMAL LOW (ref 4.0–10.5)
nRBC: 0 % (ref 0.0–0.2)

## 2018-12-14 LAB — POCT PREGNANCY, URINE: Preg Test, Ur: NEGATIVE

## 2018-12-14 LAB — COMPREHENSIVE METABOLIC PANEL
ALT: 15 U/L (ref 0–44)
AST: 18 U/L (ref 15–41)
Albumin: 2.9 g/dL — ABNORMAL LOW (ref 3.5–5.0)
Alkaline Phosphatase: 41 U/L (ref 38–126)
Anion gap: 11 (ref 5–15)
BUN: 11 mg/dL (ref 6–20)
CO2: 25 mmol/L (ref 22–32)
Calcium: 8.7 mg/dL — ABNORMAL LOW (ref 8.9–10.3)
Chloride: 101 mmol/L (ref 98–111)
Creatinine, Ser: 0.73 mg/dL (ref 0.44–1.00)
GFR calc Af Amer: >60 mL/min (ref >60)
GFR calc non Af Amer: >60 mL/min (ref >60)
Glucose, Bld: 87 mg/dL (ref 70–99)
Potassium: 3.8 mmol/L (ref 3.5–5.1)
Sodium: 137 mmol/L (ref 135–145)
Total Bilirubin: 0.6 mg/dL (ref 0.3–1.2)
Total Protein: 6 g/dL — ABNORMAL LOW (ref 6.5–8.1)

## 2018-12-14 LAB — ANTI-DNA ANTIBODY, DOUBLE-STRANDED: ds DNA Ab: 23 [IU]/mL — ABNORMAL HIGH (ref 0–9)

## 2018-12-14 LAB — LIPASE, BLOOD: Lipase: 33 U/L (ref 11–51)

## 2018-12-14 MED ORDER — DICYCLOMINE HCL 10 MG PO CAPS
10.0000 mg | ORAL_CAPSULE | Freq: Once | ORAL | Status: AC
  Administered 2018-12-14: 10 mg via ORAL
  Filled 2018-12-14: qty 1

## 2018-12-14 MED ORDER — DEXTROSE-NACL 5-0.45 % IV SOLN
Freq: Once | INTRAVENOUS | Status: AC
  Administered 2018-12-14: 14:00:00 via INTRAVENOUS

## 2018-12-14 MED ORDER — DICYCLOMINE HCL 20 MG PO TABS: 20.0000 mg | ORAL_TABLET | Freq: Three times a day (TID) | ORAL | 0 refills | Status: DC | PRN

## 2018-12-14 MED ORDER — SODIUM CHLORIDE 0.9 % IV BOLUS
1000.0000 mL | Freq: Once | INTRAVENOUS | Status: AC
  Administered 2018-12-14: 13:00:00 1000 mL via INTRAVENOUS

## 2018-12-14 NOTE — Telephone Encounter (Signed)
Pt checked back in to ER.

## 2018-12-14 NOTE — Telephone Encounter (Signed)
FYI

## 2018-12-14 NOTE — ED Triage Notes (Signed)
Pt states that she was discharged from the hospital on Saturday after being admitted on 10/27 for C-diff. Pt reports feels dizzy and lightheaded.

## 2018-12-14 NOTE — Telephone Encounter (Signed)
No HFU scheduled only a CPE on 12/25/18.

## 2018-12-14 NOTE — ED Notes (Signed)
Pt unhooked to ambulate to restroom 
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
## 2018-12-14 NOTE — ED Notes (Signed)
Provided pt with turkey sandwich 

## 2018-12-14 NOTE — Telephone Encounter (Signed)
Pt is scheduled for hospital f/u on 12/18/2018 @ 8:20 am. Thanks TNP

## 2018-12-15 ENCOUNTER — Encounter: Payer: Self-pay | Admitting: Family Medicine

## 2018-12-15 NOTE — Telephone Encounter (Signed)
Transition Care Management Follow-up Telephone Call  Date of discharge and from where: [facility] on 12/12/18  How have you been since you were released from the hospital? Doing ok but still having diarrhea. Pt was told it should be subsiding with Vancomycin. Pt has now been on medication for 6 days and only has 4 days left. Pt states her stool was getting more of a pasty consistency but now its liquid again with some stool in it. Pt states her stomach is still cramping and the pain is about the same. Pain level is an 8 and is constant. Declines fever or n/v.  Any questions or concerns? Stool consistency changing and pt wants a sooner apt- done.  Items Reviewed:  Did the pt receive and understand the discharge instructions provided? Yes   Medications obtained and verified? Declines reviewing medications over the phone.   Any new allergies since your discharge? No   Dietary orders reviewed? Yes  Do you have support at home? Yes   Other (ie: DME, Home Health, etc) N/A  Functional Questionnaire: (I = Independent and D = Dependent)  Bathing/Dressing- I   Meal Prep- I  Eating- I  Maintaining continence- I  Transferring/Ambulation- I  Managing Meds- I   Follow up appointments reviewed:    PCP Hospital f/u appt confirmed? Yes  Scheduled to see Dr Brita Romp on 12/16/18 @ 8:20 AM.  [facility] f/u appt confirmed? N/A   Are transportation arrangements needed? No   If their condition worsens, is the pt aware to call  their PCP or go to the ED? Yes  Was the patient provided with contact information for the PCP's office or ED? Yes  Was the pt encouraged to call back with questions or concerns? Yes

## 2018-12-16 ENCOUNTER — Other Ambulatory Visit: Payer: Self-pay

## 2018-12-16 ENCOUNTER — Ambulatory Visit (INDEPENDENT_AMBULATORY_CARE_PROVIDER_SITE_OTHER): Payer: Medicaid Other | Admitting: Family Medicine

## 2018-12-16 ENCOUNTER — Encounter: Payer: Self-pay | Admitting: Family Medicine

## 2018-12-16 ENCOUNTER — Encounter: Payer: Medicaid Other | Admitting: Obstetrics and Gynecology

## 2018-12-16 ENCOUNTER — Telehealth: Payer: Self-pay

## 2018-12-16 VITALS — BP 110/77 | HR 80 | Temp 97.5°F | Wt 175.0 lb

## 2018-12-16 DIAGNOSIS — G894 Chronic pain syndrome: Secondary | ICD-10-CM | POA: Diagnosis not present

## 2018-12-16 DIAGNOSIS — A0472 Enterocolitis due to Clostridium difficile, not specified as recurrent: Secondary | ICD-10-CM | POA: Diagnosis not present

## 2018-12-16 DIAGNOSIS — M329 Systemic lupus erythematosus, unspecified: Secondary | ICD-10-CM | POA: Diagnosis not present

## 2018-12-16 MED ORDER — METRONIDAZOLE 500 MG PO TABS: 500.0000 mg | ORAL_TABLET | Freq: Three times a day (TID) | ORAL | 0 refills | Status: AC

## 2018-12-16 NOTE — Patient Instructions (Signed)
Clostridioides Difficile Infection °Clostridioides difficile (C. diff) infection is caused by germs (bacteria). The infection causes irritation and swelling of the colon (colitis). It also causes watery poop (diarrhea). °This infection can be passed from person to person (is contagious). You may also get C. diff from food or water, or from touching surfaces that have the germs on them. °What are the causes? °· Certain bacteria normally live in the colon and help to digest food. This infection develops when the balance of bacteria in the colon is changed and the C. diff bacteria grow out of control. This is often caused by taking antibiotics. °What increases the risk? °· Taking antibiotics for a long time. °· Taking certain antibiotics that kill a wide range of bacteria. °· Being in the hospital. °· Being older than 27 years of age. °· Living in a place where there is a lot of contact with others, such as a nursing home. °· Having had a C. diff infection before. °· Having a weak defense (immune) system. °· Taking a medicine called a proton pump inhibitor over a long period of time. °· Having serious underlying conditions, such as colon cancer. °· Having had a gastrointestinal (GI) tract procedure or surgery. °What are the signs or symptoms? °· Diarrhea. This may be bloody, watery, yellow, or green in color. °· Fever. °· Fatigue. °· Loss of appetite. °· Nausea. °· Swelling, pain, or tenderness in the abdomen. °How is this treated? °· Stopping the antibiotics that you were on when the C. diff infection began. Do this only as told by your doctor. °· Taking certain antibiotics to stop C. diff from growing. °· Taking donor poop from a healthy person and placing it into the colon (fecal transplant). °· Having surgery to remove the infected part of the colon. This is rare. °Follow these instructions at home: °Eating and drinking ° °· Eat bland foods in small amounts as you are able. These foods  include: °? Bananas. °? Applesauce. °? Rice. °? Low-fat (lean) meats. °? Toast. °? Crackers. °· Follow your doctor's instructions on how to get enough fluids into your body (rehydrate). Drink clear fluids, such as: °? Water. °? Ice chips. °? Fruit juice that you have added water to (diluted). °? Low-calorie sports drinks. °? An ORS (oral rehydration solution). You can buy an ORS at a pharmacy or store. °· Avoid drinking: °? Milk. °? Caffeine. °? Alcohol. °· Drink enough fluid to keep your pee (urine) pale yellow. °General instructions °· Take over-the-counter and prescription medicines only as told by your doctor. °· Take your antibiotic medicine as told by your doctor. Do not stop taking the antibiotic even if you start to feel better. You may stop taking it only if your doctor tells you to stop. °· Do not use medicines to help with watery poop. You may use these medicines only if your doctor tells you to. °· Keep all follow-up visits as told by your doctor. This is important. °Prevention ° °· Hand hygiene °? Wash your hands well before you cook and after you use the bathroom. Make sure that people who live with you also wash their hands often. Use soap and water. °? If you are being treated in a hospital or clinic, make sure: °? That all doctors and nurses wash their hands with soap and water before they touch you. °? That all visitors wash their hands with soap and water before they touch you. °· Contact precautions °? If you get watery poop while   you are in the hospital or nursing home, let your doctor know right away. °? When you visit someone in the hospital or nursing home, follow the rules for wearing a gown or gloves. °? If possible, avoid contact with people who have watery poop. °· Clean environment °? Clean surfaces with a product that has chlorine bleach in it. °? If you are in the hospital, make sure that the staff cleans the surfaces in your room each day. Tell someone right away if body fluids have  spilled in your room. °? Use chlorine bleach and high heat when cleaning dirty clothing or sheets. °Contact a doctor if: °· Your symptoms do not get better. °· Your symptoms get worse. °· Your symptoms go away and then come back. °· You have a fever. °· You have new symptoms. °Get help right away if: °· You have more pain or tenderness in your belly (abdomen). °· Your poop (stool) is bloody. °· Your poop looks dark black and tarry. °· You cannot eat or drink without throwing up (vomiting). °· You have signs of not having enough fluids in your body (dehydration). These include: °? Dark pee, very little pee, or no pee. °? Cracked lips. °? No tears when you cry. °? Dry mouth. °? Sunken eyes. °? Feeling sleepy. °? Feeling weak. °? Feeling dizzy. °Summary °· C. diff infection causes watery poop. °· This infection may happen after taking antibiotic medicines. °· The infection can be passed from person to person. °· Washing your hands with soap and water can help keep C. diff from spreading. °This information is not intended to replace advice given to you by your health care provider. Make sure you discuss any questions you have with your health care provider. °Document Released: 11/25/2008 Document Revised: 10/02/2017 Document Reviewed: 10/02/2017 °Elsevier Patient Education © 2020 Elsevier Inc. ° °

## 2018-12-16 NOTE — Telephone Encounter (Signed)
Pt advised to send the forms though mychart.   Thanks,   -Mickel Baas

## 2018-12-16 NOTE — Assessment & Plan Note (Signed)
Patient has taken about 7 days of oral vancomycin and her symptoms are not improving We will switch from vancomycin to Flagyl for 10 days She can continue Bentyl as needed for abdominal cramping, but discussed that ultimately treating the infection is what is going to help with the pain Discussed strict return precautions Discussed need to stay well-hydrated and eat a bland diet

## 2018-12-16 NOTE — Assessment & Plan Note (Signed)
Chronic and stable Followed by rheumatology, nephrology No changes to medications Her chronic immunosuppressive medications likely contributed to her developing C. difficile colitis

## 2018-12-16 NOTE — Telephone Encounter (Signed)
Opened in error

## 2018-12-16 NOTE — Assessment & Plan Note (Signed)
Appears that patient has been treated with methadone and Dilaudid in the past She feels as though her SLE contributes to her chronic pain and that her pain is not well controlled Referral to pain management

## 2018-12-16 NOTE — Progress Notes (Signed)
Patient: Faith Camacho Female    DOB: 11/05/1990   28 y.o.   MRN: [id number] Visit Date: 12/16/2018  Today's Provider: Shirlee LatchAngela Muskan Bolla, MD   Chief Complaint  Patient presents with   Follow-up   Subjective:     HPI     Follow up Hospitalization  Patient was admitted to [facility]  on 12/09/2018 and discharged on 12/12/2018. She was treated for C.Difficile. Treatment for this included oral vancomycin x10 days. Telephone follow up was done on 12/14/2018 She reports excellent compliance with treatment. She reports this condition was improving, but she never got back to baseline.  She was having more formed stools, but now they are back to completely liquid and her abdominal cramping is increasing again.  She was given Bentyl in the emergency department which has helped somewhat.  She has taken Percocet from hospitalization without relief.  Describes the abdominal pain is cramping and diffuse  Patient would also like a referral to a pain management clinic.  She states she was seeing 1 in [place] about 5 years ago, but has not seen one since she moved.  She states that she has chronic pain related to her lupus.  She is followed by rheumatology and nephrology.  She has a follow-up with rheumatology later this month.  She was also seen by rheumatology during her hospitalization.  ------------------------------------------------------------------------------------     Allergies  Allergen Reactions   Ibuprofen Other (See Comments)    Can't take because of lupus     Current Outpatient Medications:    albuterol (PROVENTIL HFA;VENTOLIN HFA) 108 (90 Base) MCG/ACT inhaler, Inhale 2 puffs into the lungs every 4 (four) hours as needed for wheezing or shortness of breath., Disp: 1 Inhaler, Rfl: 0   beclomethasone (QVAR) 80 MCG/ACT inhaler, Inhale 2 puffs into the lungs 2 (two) times daily., Disp: , Rfl:    calcium citrate-vitamin D (CITRACAL+D) 315-200 MG-UNIT tablet, Take 1 tablet  by mouth 2 (two) times daily., Disp: , Rfl:    citalopram (CELEXA) 20 MG tablet, Take 1 tablet (20 mg total) by mouth daily., Disp: 30 tablet, Rfl: 3   dicyclomine (BENTYL) 20 MG tablet, Take 1 tablet (20 mg total) by mouth 3 (three) times daily as needed for spasms., Disp: 30 tablet, Rfl: 0   Erenumab-aooe (AIMOVIG) 140 MG/ML SOAJ, Inject 140 mg into the skin every 30 (thirty) days., Disp: 1 pen, Rfl: 11   gabapentin (NEURONTIN) 300 MG capsule, Take 300 mg by mouth 2 (two) times daily. , Disp: , Rfl:    hydroxychloroquine (PLAQUENIL) 200 MG tablet, Take 200 mg by mouth 2 (two) times daily. , Disp: , Rfl:    hydrOXYzine (ATARAX/VISTARIL) 10 MG tablet, Take 1 tablet (10 mg total) by mouth 3 (three) times daily as needed for anxiety., Disp: 30 tablet, Rfl: 2   losartan (COZAAR) 100 MG tablet, Take 100 mg by mouth daily., Disp: , Rfl:    mycophenolate (CELLCEPT) 500 MG tablet, Take 1,500 mg by mouth 2 (two) times daily., Disp: , Rfl:    omeprazole (PRILOSEC) 20 MG capsule, Take 20 mg by mouth daily., Disp: , Rfl:    oxyCODONE-acetaminophen (PERCOCET) 5-325 MG tablet, Take 1 tablet by mouth every 4 (four) hours as needed for severe pain., Disp: 20 tablet, Rfl: 0   potassium chloride (K-DUR) 10 MEQ tablet, Take 10 mEq by mouth daily., Disp: , Rfl:    predniSONE (DELTASONE) 5 MG tablet, Take 5 mg by mouth daily with breakfast., Disp: , Rfl:  sodium bicarbonate 325 MG tablet, Take 325 mg by mouth as needed. , Disp: , Rfl:    tacrolimus (PROGRAF) 0.5 MG capsule, Take 0.5 mg by mouth 2 (two) times daily. , Disp: , Rfl:    vancomycin (VANCOCIN HCL) 125 MG capsule, Take 1 capsule (125 mg total) by mouth 4 (four) times daily for 7 days., Disp: 28 capsule, Rfl: 0   oxyCODONE-acetaminophen (PERCOCET) 5-325 MG tablet, Take 1 tablet by mouth every 4 (four) hours as needed for severe pain., Disp: 20 tablet, Rfl: 0  Review of Systems  Constitutional: Positive for fatigue. Negative for activity  change, appetite change, chills, diaphoresis, fever and unexpected weight change.  Respiratory: Negative.   Cardiovascular: Negative.   Gastrointestinal: Positive for abdominal pain and diarrhea. Negative for abdominal distention, anal bleeding, blood in stool, constipation, nausea, rectal pain and vomiting.  Neurological: Negative for dizziness, light-headedness and headaches.    Social History   Tobacco Use   Smoking status: Never Smoker   Smokeless tobacco: Never Used  Substance Use Topics   Alcohol use: Yes    Frequency: Never    Comment: occas      Objective:   BP 110/77 (BP Location: Left Arm, Patient Position: Sitting, Cuff Size: Large)    Pulse 80    Temp (!) 97.5 F (36.4 C) (Temporal)    Wt 175 lb (79.4 kg)    LMP 12/06/2018    BMI 35.35 kg/m  Vitals:   12/16/18 0811  BP: 110/77  Pulse: 80  Temp: (!) 97.5 F (36.4 C)  TempSrc: Temporal  Weight: 175 lb (79.4 kg)  Body mass index is 35.35 kg/m.   Physical Exam Vitals signs reviewed.  Constitutional:      General: She is not in acute distress.    Appearance: Normal appearance. She is not diaphoretic.  HENT:     Head: Normocephalic and atraumatic.  Eyes:     General: No scleral icterus.    Conjunctiva/sclera: Conjunctivae normal.  Neck:     Musculoskeletal: Neck supple.  Cardiovascular:     Rate and Rhythm: Normal rate and regular rhythm.     Pulses: Normal pulses.     Heart sounds: Normal heart sounds. No murmur.  Pulmonary:     Effort: Pulmonary effort is normal. No respiratory distress.     Breath sounds: Normal breath sounds. No wheezing.  Abdominal:     General: There is no distension.     Palpations: Abdomen is soft.     Tenderness: There is abdominal tenderness (Diffuse and mild). There is no guarding or rebound.  Musculoskeletal:     Right lower leg: No edema.     Left lower leg: No edema.  Lymphadenopathy:     Cervical: No cervical adenopathy.  Skin:    General: Skin is warm and dry.       Findings: No rash.  Neurological:     Mental Status: She is alert and oriented to person, place, and time. Mental status is at baseline.  Psychiatric:        Mood and Affect: Mood normal.        Behavior: Behavior normal.      No results found for any visits on 12/16/18.     Assessment & Plan   Problem List Items Addressed This Visit      Digestive   C. difficile colitis - Primary    Patient has taken about 7 days of oral vancomycin and her symptoms are not improving We  will switch from vancomycin to Flagyl for 10 days She can continue Bentyl as needed for abdominal cramping, but discussed that ultimately treating the infection is what is going to help with the pain Discussed strict return precautions Discussed need to stay well-hydrated and eat a bland diet      Relevant Medications   metroNIDAZOLE (FLAGYL) 500 MG tablet     Musculoskeletal and Integument   SLE (systemic lupus erythematosus related syndrome) ([place])    Chronic and stable Followed by rheumatology, nephrology No changes to medications Her chronic immunosuppressive medications likely contributed to her developing C. difficile colitis      Relevant Orders   Ambulatory referral to Pain Clinic     Other   Chronic pain    Appears that patient has been treated with methadone and Dilaudid in the past She feels as though her SLE contributes to her chronic pain and that her pain is not well controlled Referral to pain management      Relevant Orders   Ambulatory referral to Pain Clinic       Return if symptoms worsen or fail to improve.   The entirety of the information documented in the History of Present Illness, Review of Systems and Physical Exam were personally obtained by me. Portions of this information were initially documented by Ashley Royalty, CMA and reviewed by me for thoroughness and accuracy.    Ivar Domangue, Dionne Bucy, MD MPH [facility]

## 2018-12-17 ENCOUNTER — Encounter: Payer: Self-pay | Admitting: Family Medicine

## 2018-12-18 ENCOUNTER — Telehealth: Payer: Self-pay | Admitting: *Deleted

## 2018-12-18 ENCOUNTER — Inpatient Hospital Stay: Payer: Medicaid Other | Admitting: Family Medicine

## 2018-12-18 NOTE — Telephone Encounter (Signed)
Form completed.  Patient needs to complete her part as well though.  Scan copy to chart and fax it

## 2018-12-18 NOTE — Telephone Encounter (Signed)
Patient called concerning her FMLA forms. Patient states she is having trouble getting form to go through [employer]. Patient is requesting one of Dr. Sharmaine Base CMA's call her back.

## 2018-12-21 ENCOUNTER — Telehealth: Payer: Self-pay

## 2018-12-21 NOTE — Telephone Encounter (Signed)
lmtcb

## 2018-12-21 NOTE — Telephone Encounter (Signed)
Pt stated that she was supposed to return to work Wednesday 12/23/2018 and she is requesting to be able to pick up a return to work tomorrow stating she can return to work Wed 12/23/2018. Please advise. Thanks TNP

## 2018-12-21 NOTE — Telephone Encounter (Signed)
FMLA forms filled 

## 2018-12-22 NOTE — Telephone Encounter (Signed)
OK to give new letter. I can sign it if you print it

## 2018-12-23 NOTE — Telephone Encounter (Signed)
Pt calling to check if her note was faxed to her work.  Please call pt back at [phone] to let her know.  Thanks, [employer]

## 2018-12-23 NOTE — Telephone Encounter (Signed)
Work note faxed. Patient advised.

## 2018-12-23 NOTE — Telephone Encounter (Signed)
Pt called asking if we could fax her return to work letter to her supervisor at [employer]. To: Alfredo Bach Fax# [phone] Please advise. Thanks TNP

## 2018-12-23 NOTE — Telephone Encounter (Signed)
Pt calling back to check on note.  Employer is asking for it.  Thanks, [employer]

## 2018-12-25 ENCOUNTER — Ambulatory Visit: Payer: Medicaid Other | Admitting: Family Medicine

## 2018-12-25 ENCOUNTER — Encounter: Payer: Self-pay | Admitting: Family Medicine

## 2018-12-25 ENCOUNTER — Other Ambulatory Visit: Payer: Self-pay

## 2018-12-25 VITALS — BP 107/77 | HR 87 | Temp 96.8°F | Ht 59.0 in | Wt 153.0 lb

## 2018-12-25 DIAGNOSIS — K219 Gastro-esophageal reflux disease without esophagitis: Secondary | ICD-10-CM

## 2018-12-25 DIAGNOSIS — E669 Obesity, unspecified: Secondary | ICD-10-CM

## 2018-12-25 DIAGNOSIS — Z0000 Encounter for general adult medical examination without abnormal findings: Secondary | ICD-10-CM

## 2018-12-25 DIAGNOSIS — N182 Chronic kidney disease, stage 2 (mild): Secondary | ICD-10-CM

## 2018-12-25 DIAGNOSIS — E782 Mixed hyperlipidemia: Secondary | ICD-10-CM

## 2018-12-25 DIAGNOSIS — J849 Interstitial pulmonary disease, unspecified: Secondary | ICD-10-CM

## 2018-12-25 DIAGNOSIS — F4323 Adjustment disorder with mixed anxiety and depressed mood: Secondary | ICD-10-CM

## 2018-12-25 DIAGNOSIS — N052 Unspecified nephritic syndrome with diffuse membranous glomerulonephritis: Secondary | ICD-10-CM | POA: Diagnosis not present

## 2018-12-25 DIAGNOSIS — I10 Essential (primary) hypertension: Secondary | ICD-10-CM

## 2018-12-25 DIAGNOSIS — Z6830 Body mass index (BMI) 30.0-30.9, adult: Secondary | ICD-10-CM | POA: Diagnosis not present

## 2018-12-25 MED ORDER — CITALOPRAM HYDROBROMIDE 40 MG PO TABS: 40.0000 mg | ORAL_TABLET | Freq: Every day | ORAL | 3 refills | Status: DC

## 2018-12-25 NOTE — Assessment & Plan Note (Signed)
Followed by [facility] Status post biopsy in 2012 Related to SLE Continue losartan Reviewed recent labs

## 2018-12-25 NOTE — Assessment & Plan Note (Signed)
Chronic and well-controlled Continue PPI Previously have discussed risks of long-term PPI use with patient

## 2018-12-25 NOTE — Assessment & Plan Note (Signed)
No recent lipid panel Not on statin Repeat FLP

## 2018-12-25 NOTE — Patient Instructions (Signed)
Preventive Care 57-27 Years Old, Female Preventive care refers to visits with your health care provider and lifestyle choices that can promote health and wellness. This includes:  A yearly physical exam. This may also be called an annual well check.  Regular dental visits and eye exams.  Immunizations.  Screening for certain conditions.  Healthy lifestyle choices, such as eating a healthy diet, getting regular exercise, not using drugs or products that contain nicotine and tobacco, and limiting alcohol use. What can I expect for my preventive care visit? Physical exam Your health care provider will check your:  Height and weight. This may be used to calculate body mass index (BMI), which tells if you are at a healthy weight.  Heart rate and blood pressure.  Skin for abnormal spots. Counseling Your health care provider may ask you questions about your:  Alcohol, tobacco, and drug use.  Emotional well-being.  Home and relationship well-being.  Sexual activity.  Eating habits.  Work and work Statistician.  Method of birth control.  Menstrual cycle.  Pregnancy history. What immunizations do I need?  Influenza (flu) vaccine  This is recommended every year. Tetanus, diphtheria, and pertussis (Tdap) vaccine  You may need a Td booster every 10 years. Varicella (chickenpox) vaccine  You may need this if you have not been vaccinated. Human papillomavirus (HPV) vaccine  If recommended by your health care provider, you may need three doses over 6 months. Measles, mumps, and rubella (MMR) vaccine  You may need at least one dose of MMR. You may also need a second dose. Meningococcal conjugate (MenACWY) vaccine  One dose is recommended if you are age 27-21 years and a first-year college student living in a residence hall, or if you have one of several medical conditions. You may also need additional booster doses. Pneumococcal conjugate (PCV13) vaccine  You may need  this if you have certain conditions and were not previously vaccinated. Pneumococcal polysaccharide (PPSV23) vaccine  You may need one or two doses if you smoke cigarettes or if you have certain conditions. Hepatitis A vaccine  You may need this if you have certain conditions or if you travel or work in places where you may be exposed to hepatitis A. Hepatitis B vaccine  You may need this if you have certain conditions or if you travel or work in places where you may be exposed to hepatitis B. Haemophilus influenzae type b (Hib) vaccine  You may need this if you have certain conditions. You may receive vaccines as individual doses or as more than one vaccine together in one shot (combination vaccines). Talk with your health care provider about the risks and benefits of combination vaccines. What tests do I need?  Blood tests  Lipid and cholesterol levels. These may be checked every 5 years starting at age 27.  Hepatitis C test.  Hepatitis B test. Screening  Diabetes screening. This is done by checking your blood sugar (glucose) after you have not eaten for a while (fasting).  Sexually transmitted disease (STD) testing.  BRCA-related cancer screening. This may be done if you have a family history of breast, ovarian, tubal, or peritoneal cancers.  Pelvic exam and Pap test. This may be done every 3 years starting at age 27. Starting at age 70, this may be done every 5 years if you have a Pap test in combination with an HPV test. Talk with your health care provider about your test results, treatment options, and if necessary, the need for more tests.  Follow these instructions at home: Eating and drinking   Eat a diet that includes fresh fruits and vegetables, whole grains, lean protein, and low-fat dairy.  Take vitamin and mineral supplements as recommended by your health care provider.  Do not drink alcohol if: ? Your health care provider tells you not to drink. ? You are  pregnant, may be pregnant, or are planning to become pregnant.  If you drink alcohol: ? Limit how much you have to 0-1 drink a day. ? Be aware of how much alcohol is in your drink. In the [place] one drink equals one 12 oz bottle of beer (355 mL), one 5 oz glass of wine (148 mL), or one 1 oz glass of hard liquor (44 mL). Lifestyle  Take daily care of your teeth and gums.  Stay active. Exercise for at least 30 minutes on 5 or more days each week.  Do not use any products that contain nicotine or tobacco, such as cigarettes, e-cigarettes, and chewing tobacco. If you need help quitting, ask your health care provider.  If you are sexually active, practice safe sex. Use a condom or other form of birth control (contraception) in order to prevent pregnancy and STIs (sexually transmitted infections). If you plan to become pregnant, see your health care provider for a preconception visit. What's next?  Visit your health care provider once a year for a well check visit.  Ask your health care provider how often you should have your eyes and teeth checked.  Stay up to date on all vaccines. This information is not intended to replace advice given to you by your health care provider. Make sure you discuss any questions you have with your health care provider. Document Released: 03/26/2001 Document Revised: 10/09/2017 Document Reviewed: 10/09/2017 Elsevier Patient Education  2020 Elsevier Inc.  

## 2018-12-25 NOTE — Assessment & Plan Note (Signed)
Discussed importance of healthy weight management Discussed diet and exercise  

## 2018-12-25 NOTE — Assessment & Plan Note (Signed)
Followed by [facility] Continue losartan Reviewed recent labs

## 2018-12-25 NOTE — Assessment & Plan Note (Signed)
Well controlled Continue current medications Recheck metabolic panel F/u in 6 months  
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
## 2018-12-25 NOTE — Assessment & Plan Note (Signed)
Chronic and slightly worsening Doing well with therapy Increase Celexa to 40 mg daily Discussed possible side effects

## 2018-12-25 NOTE — Assessment & Plan Note (Signed)
Chronic 2/2 SLE Continue QVar as controller medication Followed by Fatima Sanger

## 2018-12-26 LAB — LIPID PANEL
Chol/HDL Ratio: 3.8 ratio (ref 0.0–4.4)
Cholesterol, Total: 173 mg/dL (ref 100–199)
HDL: 46 mg/dL (ref >39)
LDL Chol Calc (NIH): 94 mg/dL (ref 0–99)
Triglycerides: 194 mg/dL — ABNORMAL HIGH (ref 0–149)
VLDL Cholesterol Cal: 33 mg/dL (ref 5–40)

## 2018-12-28 ENCOUNTER — Telehealth: Payer: Self-pay

## 2018-12-28 NOTE — Telephone Encounter (Signed)
Pt advised.   Thanks,   -Choua Chalker  

## 2018-12-28 NOTE — Telephone Encounter (Signed)
-----   Message from Virginia Crews, MD sent at 12/28/2018  9:15 AM EST ----- Normal labs

## 2019-01-01 DIAGNOSIS — J849 Interstitial pulmonary disease, unspecified: Secondary | ICD-10-CM | POA: Diagnosis not present

## 2019-01-01 DIAGNOSIS — Z79899 Other long term (current) drug therapy: Secondary | ICD-10-CM | POA: Diagnosis not present

## 2019-01-01 DIAGNOSIS — M3214 Glomerular disease in systemic lupus erythematosus: Secondary | ICD-10-CM | POA: Diagnosis not present

## 2019-01-12 ENCOUNTER — Other Ambulatory Visit: Payer: Self-pay | Admitting: Physician Assistant

## 2019-01-13 NOTE — Telephone Encounter (Signed)
Requested medication (s) are due for refill today: no  Requested medication (s) are on the active medication list: no  Last refill:  11/16/2018  Future visit scheduled: yes  Notes to clinic:  Review for refill Looks like the strength was increased    Requested Prescriptions  Pending Prescriptions Disp Refills   citalopram (CELEXA) 20 MG tablet [Pharmacy Med Name: Citalopram Hydrobromide 20 MG Oral Tablet] 30 tablet 0    Sig: Take 1 tablet by mouth once daily     Psychiatry:  Antidepressants - SSRI Failed - 01/12/2019  9:06 PM      Failed - Completed PHQ-2 or PHQ-9 in the last 360 days.      Passed - Valid encounter within last 6 months    Recent Outpatient Visits          2 weeks ago Encounter for annual physical exam   [facility] [place], Dionne Bucy, MD   4 weeks ago C. difficile colitis   [facility] [place], Dionne Bucy, MD   5 months ago [place], [place], [place]   7 months ago Pain, dental   [facility] [place], Dionne Bucy, MD   8 months ago Adjustment disorder with mixed anxiety and depressed mood   [facility], Dionne Bucy, MD      Future Appointments            In 2 months Rubie Maid, MD [facility]   In 5 months [place], Dionne Bucy, MD [facility], [place]

## 2019-01-19 ENCOUNTER — Emergency Department
Admission: EM | Admit: 2019-01-19 | Discharge: 2019-01-19 | Disposition: A | Discharge status: Home / Self Care | Payer: Medicaid Other | Attending: Emergency Medicine | Admitting: Emergency Medicine

## 2019-01-19 ENCOUNTER — Other Ambulatory Visit: Payer: Self-pay

## 2019-01-19 ENCOUNTER — Emergency Department: Payer: Medicaid Other

## 2019-01-19 DIAGNOSIS — R0902 Hypoxemia: Secondary | ICD-10-CM | POA: Diagnosis not present

## 2019-01-19 DIAGNOSIS — Z79899 Other long term (current) drug therapy: Secondary | ICD-10-CM | POA: Insufficient documentation

## 2019-01-19 DIAGNOSIS — I129 Hypertensive chronic kidney disease with stage 1 through stage 4 chronic kidney disease, or unspecified chronic kidney disease: Secondary | ICD-10-CM | POA: Diagnosis not present

## 2019-01-19 DIAGNOSIS — R0789 Other chest pain: Secondary | ICD-10-CM | POA: Insufficient documentation

## 2019-01-19 DIAGNOSIS — R0602 Shortness of breath: Secondary | ICD-10-CM | POA: Diagnosis not present

## 2019-01-19 DIAGNOSIS — I10 Essential (primary) hypertension: Secondary | ICD-10-CM | POA: Diagnosis not present

## 2019-01-19 DIAGNOSIS — R079 Chest pain, unspecified: Secondary | ICD-10-CM | POA: Diagnosis not present

## 2019-01-19 DIAGNOSIS — N182 Chronic kidney disease, stage 2 (mild): Secondary | ICD-10-CM | POA: Diagnosis not present

## 2019-01-19 DIAGNOSIS — M329 Systemic lupus erythematosus, unspecified: Secondary | ICD-10-CM | POA: Insufficient documentation

## 2019-01-19 LAB — BASIC METABOLIC PANEL
Anion gap: 8 (ref 5–15)
BUN: 11 mg/dL (ref 6–20)
CO2: 23 mmol/L (ref 22–32)
Calcium: 8.3 mg/dL — ABNORMAL LOW (ref 8.9–10.3)
Chloride: 109 mmol/L (ref 98–111)
Creatinine, Ser: 0.57 mg/dL (ref 0.44–1.00)
GFR calc Af Amer: >60 mL/min (ref >60)
GFR calc non Af Amer: >60 mL/min (ref >60)
Glucose, Bld: 85 mg/dL (ref 70–99)
Potassium: 3.3 mmol/L — ABNORMAL LOW (ref 3.5–5.1)
Sodium: 140 mmol/L (ref 135–145)

## 2019-01-19 LAB — POCT PREGNANCY, URINE: Preg Test, Ur: NEGATIVE

## 2019-01-19 LAB — CBC
HCT: 39.7 % (ref 36.0–46.0)
Hemoglobin: 12.9 g/dL (ref 12.0–15.0)
MCH: 30.6 pg (ref 26.0–34.0)
MCHC: 32.5 g/dL (ref 30.0–36.0)
MCV: 94.3 fL (ref 80.0–100.0)
Platelets: 211 10*3/uL (ref 150–400)
RBC: 4.21 MIL/uL (ref 3.87–5.11)
RDW: 13.9 % (ref 11.5–15.5)
WBC: 3.4 10*3/uL — ABNORMAL LOW (ref 4.0–10.5)
nRBC: 0 % (ref 0.0–0.2)

## 2019-01-19 LAB — TROPONIN I (HIGH SENSITIVITY)
Troponin I (High Sensitivity): 4 ng/L (ref <18)
Troponin I (High Sensitivity): 4 ng/L (ref <18)

## 2019-01-19 LAB — FIBRIN DERIVATIVES D-DIMER (ARMC ONLY): Fibrin derivatives D-dimer (ARMC): 186.71 ng/mL (FEU) (ref 0.00–499.00)

## 2019-01-19 MED ORDER — ONDANSETRON HCL 4 MG/2ML IJ SOLN
4.0000 mg | Freq: Once | INTRAMUSCULAR | Status: AC
  Administered 2019-01-19: 4 mg via INTRAVENOUS
  Filled 2019-01-19: qty 2

## 2019-01-19 MED ORDER — FENTANYL CITRATE (PF) 100 MCG/2ML IJ SOLN
50.0000 ug | Freq: Once | INTRAMUSCULAR | Status: AC
  Administered 2019-01-19: 50 ug via INTRAVENOUS
  Filled 2019-01-19: qty 2

## 2019-01-19 MED ORDER — OXYCODONE-ACETAMINOPHEN 5-325 MG PO TABS
1.0000 | ORAL_TABLET | Freq: Once | ORAL | Status: AC
  Administered 2019-01-19: 1 via ORAL
  Filled 2019-01-19: qty 1

## 2019-01-19 MED ORDER — SODIUM CHLORIDE 0.9% FLUSH
3.0000 mL | Freq: Once | INTRAVENOUS | Status: AC
  Administered 2019-01-19: 3 mL via INTRAVENOUS
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
## 2019-01-19 NOTE — ED Triage Notes (Signed)
Pt comes into the ED via EMS from work with c/o intermittent sharp/stabbing chest pain for the past week with SOB. pt is in NAD on arrival

## 2019-01-19 NOTE — ED Notes (Signed)
This RN obtained IV access with blood return however every time IV flushed pt c/o pain. EDP made aware. [place] machine remains at bedside at this time.

## 2019-01-19 NOTE — Discharge Instructions (Addendum)
Your work-up was rule reassuring and ruled you out for heart attack or blood clot.  You should return to the ER for worsening pain, shortness of breath or any other concerns.  Otherwise follow with your primary care doctor in 2 days.

## 2019-01-19 NOTE — ED Notes (Signed)
E-signature not working at this time. Pt verbalized understanding of D/C instructions, prescriptions and follow up care with no further questions at this time. Pt in NAD and ambulatory at time of D/C.  

## 2019-01-19 NOTE — ED Notes (Signed)
This RN collected Trop and D-Dimer from pt's R hand. [place], Lab tech at bedside at this time.

## 2019-01-19 NOTE — ED Notes (Signed)
IV team at bedside 

## 2019-01-19 NOTE — ED Triage Notes (Signed)
Pt in via EMS from work with c/o CP intermittently for 1 week, stabbing in nature to her mid chest worse with deep breathing. EMS reports per pt she has had this pain before and had a stress test that was negative.

## 2019-01-19 NOTE — ED Notes (Signed)
Patient stayed at 99% on room air while ambulating. Dr. Jari Pigg aware.

## 2019-01-21 ENCOUNTER — Emergency Department: Payer: Medicaid Other

## 2019-01-21 ENCOUNTER — Emergency Department
Admission: EM | Admit: 2019-01-21 | Discharge: 2019-01-22 | Disposition: A | Discharge status: Home / Self Care | Payer: Medicaid Other | Attending: Emergency Medicine | Admitting: Emergency Medicine

## 2019-01-21 ENCOUNTER — Encounter: Payer: Self-pay | Admitting: Emergency Medicine

## 2019-01-21 ENCOUNTER — Other Ambulatory Visit: Payer: Self-pay

## 2019-01-21 DIAGNOSIS — Z20828 Contact with and (suspected) exposure to other viral communicable diseases: Secondary | ICD-10-CM | POA: Insufficient documentation

## 2019-01-21 DIAGNOSIS — N182 Chronic kidney disease, stage 2 (mild): Secondary | ICD-10-CM | POA: Diagnosis not present

## 2019-01-21 DIAGNOSIS — R0789 Other chest pain: Secondary | ICD-10-CM | POA: Diagnosis not present

## 2019-01-21 DIAGNOSIS — R079 Chest pain, unspecified: Secondary | ICD-10-CM | POA: Insufficient documentation

## 2019-01-21 DIAGNOSIS — R0602 Shortness of breath: Secondary | ICD-10-CM | POA: Diagnosis not present

## 2019-01-21 DIAGNOSIS — I129 Hypertensive chronic kidney disease with stage 1 through stage 4 chronic kidney disease, or unspecified chronic kidney disease: Secondary | ICD-10-CM | POA: Diagnosis not present

## 2019-01-21 LAB — TROPONIN I (HIGH SENSITIVITY): Troponin I (High Sensitivity): 3 ng/L (ref <18)

## 2019-01-21 LAB — CBC
HCT: 38.2 % (ref 36.0–46.0)
Hemoglobin: 12.5 g/dL (ref 12.0–15.0)
MCH: 30.6 pg (ref 26.0–34.0)
MCHC: 32.7 g/dL (ref 30.0–36.0)
MCV: 93.6 fL (ref 80.0–100.0)
Platelets: 203 10*3/uL (ref 150–400)
RBC: 4.08 MIL/uL (ref 3.87–5.11)
RDW: 13.6 % (ref 11.5–15.5)
WBC: 3.6 10*3/uL — ABNORMAL LOW (ref 4.0–10.5)
nRBC: 0 % (ref 0.0–0.2)

## 2019-01-21 LAB — BASIC METABOLIC PANEL
Anion gap: 8 (ref 5–15)
BUN: 22 mg/dL — ABNORMAL HIGH (ref 6–20)
CO2: 21 mmol/L — ABNORMAL LOW (ref 22–32)
Calcium: 8.4 mg/dL — ABNORMAL LOW (ref 8.9–10.3)
Chloride: 109 mmol/L (ref 98–111)
Creatinine, Ser: 0.69 mg/dL (ref 0.44–1.00)
GFR calc Af Amer: >60 mL/min (ref >60)
GFR calc non Af Amer: >60 mL/min (ref >60)
Glucose, Bld: 86 mg/dL (ref 70–99)
Potassium: 3.2 mmol/L — ABNORMAL LOW (ref 3.5–5.1)
Sodium: 138 mmol/L (ref 135–145)

## 2019-01-21 LAB — POCT PREGNANCY, URINE: Preg Test, Ur: NEGATIVE

## 2019-01-21 MED ORDER — SODIUM CHLORIDE 0.9% FLUSH: 10.0000 mL | INTRAVENOUS | Status: DC | PRN

## 2019-01-21 MED ORDER — HYDROMORPHONE HCL 1 MG/ML IJ SOLN
0.5000 mg | Freq: Once | INTRAMUSCULAR | Status: AC
  Administered 2019-01-21: 0.5 mg via INTRAVENOUS
  Filled 2019-01-21: qty 1

## 2019-01-21 MED ORDER — IOHEXOL 350 MG/ML SOLN
75.0000 mL | Freq: Once | INTRAVENOUS | Status: AC | PRN
  Administered 2019-01-21: 75 mL via INTRAVENOUS
  Filled 2019-01-21: qty 75

## 2019-01-21 MED ORDER — OXYCODONE-ACETAMINOPHEN 5-325 MG PO TABS
1.0000 | ORAL_TABLET | Freq: Once | ORAL | Status: AC
  Administered 2019-01-21: 1 via ORAL
  Filled 2019-01-21: qty 1

## 2019-01-21 MED ORDER — DEXAMETHASONE SODIUM PHOSPHATE 10 MG/ML IJ SOLN
10.0000 mg | Freq: Once | INTRAMUSCULAR | Status: AC
  Administered 2019-01-21: 10 mg via INTRAVENOUS
  Filled 2019-01-21: qty 1

## 2019-01-21 NOTE — ED Notes (Signed)
Called CT to notify pt has IV access from IV team.

## 2019-01-21 NOTE — ED Notes (Signed)
Iv team remains at bedside. Will give meds once IV team done.
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
## 2019-01-21 NOTE — ED Notes (Signed)
Pt here for cp - hx of lupus. Given cup for urine sample and phone to call and update her family.

## 2019-01-21 NOTE — ED Notes (Signed)
IV team unable to flush initial midline, unable to place another. Raquel RN able to place IV access. Pt to CT scan

## 2019-01-21 NOTE — ED Notes (Signed)
Per ED Charge RN IV team currently on way to pt's room.

## 2019-01-21 NOTE — ED Notes (Signed)
Pt used call bell to inquire when IV team would be back. Explained delay.

## 2019-01-21 NOTE — ED Notes (Signed)
IV team at bedside 

## 2019-01-21 NOTE — ED Notes (Signed)
Trop sent to lab

## 2019-01-21 NOTE — ED Notes (Signed)
Patient transported to CT 

## 2019-01-21 NOTE — ED Triage Notes (Signed)
Pt in via POV, reports ongoing chest pain w/ shortness of breath since last week, states she was seen here Tuesday for same, work up unremarkable.  Ambulatory to triage, NAD noted at this time.

## 2019-01-22 LAB — SARS CORONAVIRUS 2 (TAT 6-24 HRS): SARS Coronavirus 2: NEGATIVE

## 2019-01-22 MED ORDER — PREDNISONE 10 MG (21) PO TBPK: ORAL_TABLET | ORAL | 0 refills | Status: DC

## 2019-01-22 NOTE — ED Notes (Signed)
Pt states pain is improving. No SOB noted, no apparent distress. Pt ambulating in room without difficulty. Pt wheeled to lobby in wheel chair to wait for family.

## 2019-01-22 NOTE — ED Provider Notes (Signed)
  Physical Exam  BP 116/79 (BP Location: Right Arm)   Pulse 64   Temp 98.3 F (36.8 C) (Oral)   Resp 16   Ht 4\' 11"  (1.499 m)   Wt 74.8 kg   LMP 12/27/2018 (Approximate)   SpO2 98%   BMI 33.33 kg/m   Physical Exam  ED Course/Procedures     Procedures  MDM   Assumed care for Dr. Lenise Arena.  CTA returned with no evidence of PE.  Patient was discharged home with prednisone taper per Dr. Jimmye Norman request.  All patient questions were answered.       Vallarie Mare Flanagan, PA-C 01/22/19 0009    Earleen Newport, MD 01/22/19 [phone]

## 2019-02-04 ENCOUNTER — Encounter: Payer: Medicaid Other | Admitting: Obstetrics and Gynecology

## 2019-03-12 ENCOUNTER — Encounter: Payer: Self-pay | Admitting: Emergency Medicine

## 2019-03-12 ENCOUNTER — Emergency Department
Admission: EM | Admit: 2019-03-12 | Discharge: 2019-03-12 | Disposition: A | Discharge status: Home / Self Care | Payer: Medicaid Other | Attending: Emergency Medicine | Admitting: Emergency Medicine

## 2019-03-12 ENCOUNTER — Other Ambulatory Visit: Payer: Self-pay

## 2019-03-12 ENCOUNTER — Emergency Department: Payer: Medicaid Other

## 2019-03-12 DIAGNOSIS — Z79899 Other long term (current) drug therapy: Secondary | ICD-10-CM | POA: Diagnosis not present

## 2019-03-12 DIAGNOSIS — R519 Headache, unspecified: Secondary | ICD-10-CM | POA: Insufficient documentation

## 2019-03-12 DIAGNOSIS — M3210 Systemic lupus erythematosus, organ or system involvement unspecified: Secondary | ICD-10-CM | POA: Insufficient documentation

## 2019-03-12 DIAGNOSIS — R197 Diarrhea, unspecified: Secondary | ICD-10-CM | POA: Insufficient documentation

## 2019-03-12 DIAGNOSIS — R5381 Other malaise: Secondary | ICD-10-CM | POA: Diagnosis not present

## 2019-03-12 DIAGNOSIS — Z20822 Contact with and (suspected) exposure to covid-19: Secondary | ICD-10-CM | POA: Diagnosis not present

## 2019-03-12 DIAGNOSIS — S0003XA Contusion of scalp, initial encounter: Secondary | ICD-10-CM | POA: Diagnosis not present

## 2019-03-12 DIAGNOSIS — I129 Hypertensive chronic kidney disease with stage 1 through stage 4 chronic kidney disease, or unspecified chronic kidney disease: Secondary | ICD-10-CM | POA: Insufficient documentation

## 2019-03-12 DIAGNOSIS — G43909 Migraine, unspecified, not intractable, without status migrainosus: Secondary | ICD-10-CM | POA: Diagnosis not present

## 2019-03-12 DIAGNOSIS — N182 Chronic kidney disease, stage 2 (mild): Secondary | ICD-10-CM | POA: Insufficient documentation

## 2019-03-12 LAB — POCT PREGNANCY, URINE: Preg Test, Ur: NEGATIVE

## 2019-03-12 LAB — COMPREHENSIVE METABOLIC PANEL
ALT: 10 U/L (ref 0–44)
AST: 13 U/L — ABNORMAL LOW (ref 15–41)
Albumin: 3.4 g/dL — ABNORMAL LOW (ref 3.5–5.0)
Alkaline Phosphatase: 44 U/L (ref 38–126)
Anion gap: 10 (ref 5–15)
BUN: 12 mg/dL (ref 6–20)
CO2: 22 mmol/L (ref 22–32)
Calcium: 8.7 mg/dL — ABNORMAL LOW (ref 8.9–10.3)
Chloride: 105 mmol/L (ref 98–111)
Creatinine, Ser: 0.71 mg/dL (ref 0.44–1.00)
GFR calc Af Amer: >60 mL/min (ref >60)
GFR calc non Af Amer: >60 mL/min (ref >60)
Glucose, Bld: 79 mg/dL (ref 70–99)
Potassium: 3.6 mmol/L (ref 3.5–5.1)
Sodium: 137 mmol/L (ref 135–145)
Total Bilirubin: 0.7 mg/dL (ref 0.3–1.2)
Total Protein: 6.6 g/dL (ref 6.5–8.1)

## 2019-03-12 LAB — CBC
HCT: 44.2 % (ref 36.0–46.0)
Hemoglobin: 14.3 g/dL (ref 12.0–15.0)
MCH: 30.2 pg (ref 26.0–34.0)
MCHC: 32.4 g/dL (ref 30.0–36.0)
MCV: 93.4 fL (ref 80.0–100.0)
Platelets: 232 10*3/uL (ref 150–400)
RBC: 4.73 MIL/uL (ref 3.87–5.11)
RDW: 13.8 % (ref 11.5–15.5)
WBC: 4 10*3/uL (ref 4.0–10.5)
nRBC: 0 % (ref 0.0–0.2)

## 2019-03-12 LAB — URINALYSIS, COMPLETE (UACMP) WITH MICROSCOPIC
Bilirubin Urine: NEGATIVE
Glucose, UA: NEGATIVE mg/dL
Hgb urine dipstick: NEGATIVE
Ketones, ur: NEGATIVE mg/dL
Nitrite: NEGATIVE
Protein, ur: 100 mg/dL — AB
Specific Gravity, Urine: 1.006 (ref 1.005–1.030)
pH: 6 (ref 5.0–8.0)

## 2019-03-12 LAB — SARS CORONAVIRUS 2 (TAT 6-24 HRS): SARS Coronavirus 2: NEGATIVE

## 2019-03-12 LAB — LIPASE, BLOOD: Lipase: 28 U/L (ref 11–51)

## 2019-03-12 MED ORDER — DIPHENHYDRAMINE HCL 50 MG/ML IJ SOLN: 25.0000 mg | Freq: Once | INTRAMUSCULAR | Status: DC

## 2019-03-12 MED ORDER — DEXAMETHASONE SODIUM PHOSPHATE 10 MG/ML IJ SOLN: 10.0000 mg | Freq: Once | INTRAMUSCULAR | Status: DC

## 2019-03-12 MED ORDER — SODIUM CHLORIDE 0.9 % IV BOLUS: 500.0000 mL | Freq: Once | INTRAVENOUS | Status: DC

## 2019-03-12 MED ORDER — METOCLOPRAMIDE HCL 5 MG/ML IJ SOLN: 10.0000 mg | Freq: Once | INTRAMUSCULAR | Status: DC

## 2019-03-12 MED ORDER — DIPHENHYDRAMINE HCL 50 MG/ML IJ SOLN
25.0000 mg | Freq: Once | INTRAMUSCULAR | Status: AC
  Administered 2019-03-12: 25 mg via INTRAMUSCULAR
  Filled 2019-03-12: qty 1

## 2019-03-12 MED ORDER — METOCLOPRAMIDE HCL 5 MG/ML IJ SOLN
10.0000 mg | Freq: Once | INTRAMUSCULAR | Status: AC
  Administered 2019-03-12: 10 mg via INTRAMUSCULAR
  Filled 2019-03-12: qty 2

## 2019-03-12 MED ORDER — DEXAMETHASONE 10 MG/ML FOR PEDIATRIC ORAL USE
10.0000 mg | Freq: Once | INTRAMUSCULAR | Status: AC
  Administered 2019-03-12: 17:00:00 10 mg via ORAL
  Filled 2019-03-12: qty 1
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
## 2019-03-12 NOTE — ED Triage Notes (Signed)
Pt has had migraine for 1 week.  Feels different than normal migraines because pain is in different location and sharper in nature.  Has also had diarrhea for this week.  No vomiting. No fevers.  covid exposure over one month ago.  NAD. Alert and oriented.  Hx lupus and ILD

## 2019-04-01 ENCOUNTER — Encounter: Payer: Medicaid Other | Admitting: Obstetrics and Gynecology

## 2019-04-02 ENCOUNTER — Other Ambulatory Visit: Payer: Self-pay

## 2019-04-02 ENCOUNTER — Encounter: Payer: Self-pay | Admitting: Intensive Care

## 2019-04-02 ENCOUNTER — Emergency Department
Admission: EM | Admit: 2019-04-02 | Discharge: 2019-04-02 | Disposition: A | Discharge status: Home / Self Care | Payer: Medicaid Other | Attending: Emergency Medicine | Admitting: Emergency Medicine

## 2019-04-02 DIAGNOSIS — N182 Chronic kidney disease, stage 2 (mild): Secondary | ICD-10-CM | POA: Diagnosis not present

## 2019-04-02 DIAGNOSIS — M329 Systemic lupus erythematosus, unspecified: Secondary | ICD-10-CM | POA: Diagnosis not present

## 2019-04-02 DIAGNOSIS — M791 Myalgia, unspecified site: Secondary | ICD-10-CM | POA: Insufficient documentation

## 2019-04-02 DIAGNOSIS — E876 Hypokalemia: Secondary | ICD-10-CM | POA: Insufficient documentation

## 2019-04-02 DIAGNOSIS — Z79899 Other long term (current) drug therapy: Secondary | ICD-10-CM | POA: Diagnosis not present

## 2019-04-02 DIAGNOSIS — I129 Hypertensive chronic kidney disease with stage 1 through stage 4 chronic kidney disease, or unspecified chronic kidney disease: Secondary | ICD-10-CM | POA: Diagnosis not present

## 2019-04-02 DIAGNOSIS — I10 Essential (primary) hypertension: Secondary | ICD-10-CM | POA: Diagnosis not present

## 2019-04-02 DIAGNOSIS — Z20822 Contact with and (suspected) exposure to covid-19: Secondary | ICD-10-CM | POA: Insufficient documentation

## 2019-04-02 DIAGNOSIS — R52 Pain, unspecified: Secondary | ICD-10-CM | POA: Diagnosis not present

## 2019-04-02 LAB — CBC WITH DIFFERENTIAL/PLATELET
Abs Immature Granulocytes: 0.01 10*3/uL (ref 0.00–0.07)
Basophils Absolute: 0 10*3/uL (ref 0.0–0.1)
Basophils Relative: 0 %
Eosinophils Absolute: 0 10*3/uL (ref 0.0–0.5)
Eosinophils Relative: 1 %
HCT: 36.3 % (ref 36.0–46.0)
Hemoglobin: 11.9 g/dL — ABNORMAL LOW (ref 12.0–15.0)
Immature Granulocytes: 0 %
Lymphocytes Relative: 28 %
Lymphs Abs: 1 10*3/uL (ref 0.7–4.0)
MCH: 30.3 pg (ref 26.0–34.0)
MCHC: 32.8 g/dL (ref 30.0–36.0)
MCV: 92.4 fL (ref 80.0–100.0)
Monocytes Absolute: 0.3 10*3/uL (ref 0.1–1.0)
Monocytes Relative: 10 %
Neutro Abs: 2.1 10*3/uL (ref 1.7–7.7)
Neutrophils Relative %: 61 %
Platelets: 212 10*3/uL (ref 150–400)
RBC: 3.93 MIL/uL (ref 3.87–5.11)
RDW: 14.3 % (ref 11.5–15.5)
WBC: 3.4 10*3/uL — ABNORMAL LOW (ref 4.0–10.5)
nRBC: 0 % (ref 0.0–0.2)

## 2019-04-02 LAB — URINALYSIS, COMPLETE (UACMP) WITH MICROSCOPIC
Bacteria, UA: NONE SEEN
Bilirubin Urine: NEGATIVE
Glucose, UA: NEGATIVE mg/dL
Hgb urine dipstick: NEGATIVE
Ketones, ur: NEGATIVE mg/dL
Nitrite: NEGATIVE
Protein, ur: >=300 mg/dL — AB
Specific Gravity, Urine: 1.027 (ref 1.005–1.030)
pH: 6 (ref 5.0–8.0)

## 2019-04-02 LAB — COMPREHENSIVE METABOLIC PANEL
ALT: 9 U/L (ref 0–44)
AST: 14 U/L — ABNORMAL LOW (ref 15–41)
Albumin: 2.9 g/dL — ABNORMAL LOW (ref 3.5–5.0)
Alkaline Phosphatase: 43 U/L (ref 38–126)
Anion gap: 12 (ref 5–15)
BUN: 11 mg/dL (ref 6–20)
CO2: 24 mmol/L (ref 22–32)
Calcium: 8.1 mg/dL — ABNORMAL LOW (ref 8.9–10.3)
Chloride: 103 mmol/L (ref 98–111)
Creatinine, Ser: 0.56 mg/dL (ref 0.44–1.00)
GFR calc Af Amer: >60 mL/min (ref >60)
GFR calc non Af Amer: >60 mL/min (ref >60)
Glucose, Bld: 80 mg/dL (ref 70–99)
Potassium: 2.8 mmol/L — ABNORMAL LOW (ref 3.5–5.1)
Sodium: 139 mmol/L (ref 135–145)
Total Bilirubin: 0.7 mg/dL (ref 0.3–1.2)
Total Protein: 5.7 g/dL — ABNORMAL LOW (ref 6.5–8.1)

## 2019-04-02 LAB — CK: Total CK: 64 U/L (ref 38–234)

## 2019-04-02 LAB — SEDIMENTATION RATE: Sed Rate: 30 mm/hr — ABNORMAL HIGH (ref 0–20)

## 2019-04-02 MED ORDER — MORPHINE SULFATE (PF) 4 MG/ML IV SOLN
4.0000 mg | Freq: Once | INTRAVENOUS | Status: AC
  Administered 2019-04-02: 4 mg via INTRAVENOUS
  Filled 2019-04-02: qty 1

## 2019-04-02 MED ORDER — ONDANSETRON HCL 4 MG/2ML IJ SOLN
4.0000 mg | Freq: Once | INTRAMUSCULAR | Status: AC
  Administered 2019-04-02: 4 mg via INTRAVENOUS
  Filled 2019-04-02: qty 2

## 2019-04-02 MED ORDER — METHYLPREDNISOLONE SODIUM SUCC 125 MG IJ SOLR
125.0000 mg | Freq: Once | INTRAMUSCULAR | Status: AC
  Administered 2019-04-02: 125 mg via INTRAVENOUS
  Filled 2019-04-02: qty 2

## 2019-04-02 MED ORDER — POTASSIUM CHLORIDE 10 MEQ/100ML IV SOLN
10.0000 meq | Freq: Once | INTRAVENOUS | Status: AC
  Administered 2019-04-02: 10 meq via INTRAVENOUS
  Filled 2019-04-02: qty 100

## 2019-04-02 MED ORDER — SODIUM CHLORIDE 0.9 % IV BOLUS
1000.0000 mL | Freq: Once | INTRAVENOUS | Status: AC
  Administered 2019-04-02: 1000 mL via INTRAVENOUS

## 2019-04-02 MED ORDER — POTASSIUM CHLORIDE CRYS ER 20 MEQ PO TBCR
40.0000 meq | EXTENDED_RELEASE_TABLET | Freq: Once | ORAL | Status: AC
  Administered 2019-04-02: 40 meq via ORAL
  Filled 2019-04-02: qty 2

## 2019-04-02 NOTE — ED Triage Notes (Signed)
Patient c/o fatigue and body aches that started Monday. NAD noted.

## 2019-04-02 NOTE — ED Notes (Signed)
Patient ambulated to exam room with a steady gait.
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
## 2019-04-03 ENCOUNTER — Other Ambulatory Visit: Payer: Self-pay

## 2019-04-03 ENCOUNTER — Emergency Department
Admission: EM | Admit: 2019-04-03 | Discharge: 2019-04-03 | Disposition: A | Discharge status: Home / Self Care | Payer: Medicaid Other | Attending: Student | Admitting: Student

## 2019-04-03 ENCOUNTER — Encounter: Payer: Self-pay | Admitting: Intensive Care

## 2019-04-03 DIAGNOSIS — M7918 Myalgia, other site: Secondary | ICD-10-CM | POA: Insufficient documentation

## 2019-04-03 DIAGNOSIS — N182 Chronic kidney disease, stage 2 (mild): Secondary | ICD-10-CM | POA: Diagnosis not present

## 2019-04-03 DIAGNOSIS — Z9049 Acquired absence of other specified parts of digestive tract: Secondary | ICD-10-CM | POA: Diagnosis not present

## 2019-04-03 DIAGNOSIS — Z79899 Other long term (current) drug therapy: Secondary | ICD-10-CM | POA: Insufficient documentation

## 2019-04-03 DIAGNOSIS — I129 Hypertensive chronic kidney disease with stage 1 through stage 4 chronic kidney disease, or unspecified chronic kidney disease: Secondary | ICD-10-CM | POA: Diagnosis not present

## 2019-04-03 DIAGNOSIS — M791 Myalgia, unspecified site: Secondary | ICD-10-CM | POA: Diagnosis not present

## 2019-04-03 DIAGNOSIS — R52 Pain, unspecified: Secondary | ICD-10-CM

## 2019-04-03 LAB — COMPREHENSIVE METABOLIC PANEL
ALT: 9 U/L (ref 0–44)
AST: 15 U/L (ref 15–41)
Albumin: 2.7 g/dL — ABNORMAL LOW (ref 3.5–5.0)
Alkaline Phosphatase: 43 U/L (ref 38–126)
Anion gap: 6 (ref 5–15)
BUN: 10 mg/dL (ref 6–20)
CO2: 24 mmol/L (ref 22–32)
Calcium: 8.3 mg/dL — ABNORMAL LOW (ref 8.9–10.3)
Chloride: 108 mmol/L (ref 98–111)
Creatinine, Ser: 0.59 mg/dL (ref 0.44–1.00)
GFR calc Af Amer: >60 mL/min (ref >60)
GFR calc non Af Amer: >60 mL/min (ref >60)
Glucose, Bld: 90 mg/dL (ref 70–99)
Potassium: 3.3 mmol/L — ABNORMAL LOW (ref 3.5–5.1)
Sodium: 138 mmol/L (ref 135–145)
Total Bilirubin: 0.4 mg/dL (ref 0.3–1.2)
Total Protein: 5.6 g/dL — ABNORMAL LOW (ref 6.5–8.1)

## 2019-04-03 LAB — CBC WITH DIFFERENTIAL/PLATELET
Abs Immature Granulocytes: 0.01 10*3/uL (ref 0.00–0.07)
Basophils Absolute: 0 10*3/uL (ref 0.0–0.1)
Basophils Relative: 0 %
Eosinophils Absolute: 0 10*3/uL (ref 0.0–0.5)
Eosinophils Relative: 0 %
HCT: 37.7 % (ref 36.0–46.0)
Hemoglobin: 12.3 g/dL (ref 12.0–15.0)
Immature Granulocytes: 0 %
Lymphocytes Relative: 25 %
Lymphs Abs: 1 10*3/uL (ref 0.7–4.0)
MCH: 30.2 pg (ref 26.0–34.0)
MCHC: 32.6 g/dL (ref 30.0–36.0)
MCV: 92.6 fL (ref 80.0–100.0)
Monocytes Absolute: 0.4 10*3/uL (ref 0.1–1.0)
Monocytes Relative: 10 %
Neutro Abs: 2.6 10*3/uL (ref 1.7–7.7)
Neutrophils Relative %: 65 %
Platelets: 216 10*3/uL (ref 150–400)
RBC: 4.07 MIL/uL (ref 3.87–5.11)
RDW: 14.4 % (ref 11.5–15.5)
WBC: 4.1 10*3/uL (ref 4.0–10.5)
nRBC: 0 % (ref 0.0–0.2)

## 2019-04-03 LAB — PREGNANCY, URINE: Preg Test, Ur: NEGATIVE

## 2019-04-03 LAB — SARS CORONAVIRUS 2 (TAT 6-24 HRS): SARS Coronavirus 2: NEGATIVE

## 2019-04-03 MED ORDER — METHYLPREDNISOLONE SODIUM SUCC 125 MG IJ SOLR
125.0000 mg | Freq: Once | INTRAMUSCULAR | Status: AC
  Administered 2019-04-03: 125 mg via INTRAVENOUS
  Filled 2019-04-03: qty 2

## 2019-04-03 MED ORDER — PREDNISONE 10 MG (21) PO TBPK: ORAL_TABLET | ORAL | 0 refills | Status: DC

## 2019-04-03 MED ORDER — FENTANYL CITRATE (PF) 100 MCG/2ML IJ SOLN
100.0000 ug | Freq: Once | INTRAMUSCULAR | Status: AC
  Administered 2019-04-03: 18:00:00 100 ug via INTRAVENOUS
  Filled 2019-04-03: qty 2

## 2019-04-03 MED ORDER — DIPHENHYDRAMINE HCL 50 MG/ML IJ SOLN
12.5000 mg | Freq: Once | INTRAMUSCULAR | Status: AC
  Administered 2019-04-03: 19:00:00 12.5 mg via INTRAVENOUS
  Filled 2019-04-03: qty 1

## 2019-04-03 NOTE — ED Notes (Signed)
Pt given juice per EDP. Pt tolerating well.
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
## 2019-04-03 NOTE — ED Notes (Signed)
Per MD stafford, send to main side and no repeat labs/ orders at this time

## 2019-04-03 NOTE — ED Notes (Signed)
Pt states that she does not feel comfortable going home because she is in pain and she will return to ER because of pain. EPD notified

## 2019-04-03 NOTE — ED Notes (Signed)
Pt states she has family picking her up, refused wheelchair. Pt ambulating to lobby, gait steady, no c/o dizziness. Pt alert and not drowsy

## 2019-04-03 NOTE — ED Notes (Signed)
Pt with mild itching, states it started after she had pain medicine. No SOB, denies tongue or throat swelling or difficulty breathing. EDP notified.

## 2019-04-03 NOTE — ED Triage Notes (Addendum)
Patient was just seen at [facility] ED 04/02/19 and had labs drawn, urinalysis, and covid swab completed. Reports her pain is worse "all over" and feels like her feet are swelling. Patient ambulatory to triage with no problems.

## 2019-04-03 NOTE — ED Notes (Signed)
First Nurse Note: Pt to ED via POV stating that she was seen yesterday with body aches and not feeling well. Pt states that her body aches have gotten worse. Pt is in NAD.

## 2019-04-03 NOTE — ED Notes (Signed)
Pt in bed, blanket given. No apparent distress, c/o 10/10 generalized body aches.

## 2019-04-07 ENCOUNTER — Encounter: Payer: Self-pay | Admitting: Emergency Medicine

## 2019-04-07 ENCOUNTER — Other Ambulatory Visit: Payer: Self-pay

## 2019-04-07 ENCOUNTER — Emergency Department
Admission: EM | Admit: 2019-04-07 | Discharge: 2019-04-07 | Disposition: A | Discharge status: Home / Self Care | Payer: Medicaid Other | Attending: Emergency Medicine | Admitting: Emergency Medicine

## 2019-04-07 DIAGNOSIS — M3213 Lung involvement in systemic lupus erythematosus: Secondary | ICD-10-CM | POA: Diagnosis not present

## 2019-04-07 DIAGNOSIS — N182 Chronic kidney disease, stage 2 (mild): Secondary | ICD-10-CM | POA: Insufficient documentation

## 2019-04-07 DIAGNOSIS — E876 Hypokalemia: Secondary | ICD-10-CM | POA: Diagnosis not present

## 2019-04-07 DIAGNOSIS — Z79899 Other long term (current) drug therapy: Secondary | ICD-10-CM | POA: Diagnosis not present

## 2019-04-07 DIAGNOSIS — Y92410 Unspecified street and highway as the place of occurrence of the external cause: Secondary | ICD-10-CM | POA: Diagnosis not present

## 2019-04-07 DIAGNOSIS — M329 Systemic lupus erythematosus, unspecified: Secondary | ICD-10-CM | POA: Diagnosis not present

## 2019-04-07 DIAGNOSIS — I129 Hypertensive chronic kidney disease with stage 1 through stage 4 chronic kidney disease, or unspecified chronic kidney disease: Secondary | ICD-10-CM | POA: Diagnosis not present

## 2019-04-07 DIAGNOSIS — M791 Myalgia, unspecified site: Secondary | ICD-10-CM | POA: Diagnosis not present

## 2019-04-07 DIAGNOSIS — M25511 Pain in right shoulder: Secondary | ICD-10-CM | POA: Diagnosis not present

## 2019-04-07 DIAGNOSIS — M25512 Pain in left shoulder: Secondary | ICD-10-CM | POA: Diagnosis not present

## 2019-04-07 DIAGNOSIS — R52 Pain, unspecified: Secondary | ICD-10-CM

## 2019-04-07 DIAGNOSIS — Y999 Unspecified external cause status: Secondary | ICD-10-CM | POA: Diagnosis not present

## 2019-04-07 LAB — URINALYSIS, COMPLETE (UACMP) WITH MICROSCOPIC
Bacteria, UA: NONE SEEN
Bilirubin Urine: NEGATIVE
Glucose, UA: NEGATIVE mg/dL
Hgb urine dipstick: NEGATIVE
Ketones, ur: NEGATIVE mg/dL
Nitrite: NEGATIVE
Protein, ur: >=300 mg/dL — AB
Specific Gravity, Urine: 1.023 (ref 1.005–1.030)
pH: 6 (ref 5.0–8.0)

## 2019-04-07 LAB — CBC WITH DIFFERENTIAL/PLATELET
Abs Immature Granulocytes: 0.02 10*3/uL (ref 0.00–0.07)
Basophils Absolute: 0 10*3/uL (ref 0.0–0.1)
Basophils Relative: 0 %
Eosinophils Absolute: 0 10*3/uL (ref 0.0–0.5)
Eosinophils Relative: 1 %
HCT: 39.1 % (ref 36.0–46.0)
Hemoglobin: 12.7 g/dL (ref 12.0–15.0)
Immature Granulocytes: 1 %
Lymphocytes Relative: 20 %
Lymphs Abs: 0.8 10*3/uL (ref 0.7–4.0)
MCH: 30.2 pg (ref 26.0–34.0)
MCHC: 32.5 g/dL (ref 30.0–36.0)
MCV: 93.1 fL (ref 80.0–100.0)
Monocytes Absolute: 0.3 10*3/uL (ref 0.1–1.0)
Monocytes Relative: 9 %
Neutro Abs: 2.6 10*3/uL (ref 1.7–7.7)
Neutrophils Relative %: 69 %
Platelets: 254 10*3/uL (ref 150–400)
RBC: 4.2 MIL/uL (ref 3.87–5.11)
RDW: 14.1 % (ref 11.5–15.5)
WBC: 3.7 10*3/uL — ABNORMAL LOW (ref 4.0–10.5)
nRBC: 0 % (ref 0.0–0.2)

## 2019-04-07 LAB — COMPREHENSIVE METABOLIC PANEL
ALT: 12 U/L (ref 0–44)
AST: 15 U/L (ref 15–41)
Albumin: 3.1 g/dL — ABNORMAL LOW (ref 3.5–5.0)
Alkaline Phosphatase: 46 U/L (ref 38–126)
Anion gap: 9 (ref 5–15)
BUN: 12 mg/dL (ref 6–20)
CO2: 28 mmol/L (ref 22–32)
Calcium: 8.3 mg/dL — ABNORMAL LOW (ref 8.9–10.3)
Chloride: 102 mmol/L (ref 98–111)
Creatinine, Ser: 0.59 mg/dL (ref 0.44–1.00)
GFR calc Af Amer: >60 mL/min (ref >60)
GFR calc non Af Amer: >60 mL/min (ref >60)
Glucose, Bld: 78 mg/dL (ref 70–99)
Potassium: 2.8 mmol/L — ABNORMAL LOW (ref 3.5–5.1)
Sodium: 139 mmol/L (ref 135–145)
Total Bilirubin: 0.6 mg/dL (ref 0.3–1.2)
Total Protein: 6 g/dL — ABNORMAL LOW (ref 6.5–8.1)

## 2019-04-07 MED ORDER — DEXAMETHASONE SODIUM PHOSPHATE 10 MG/ML IJ SOLN
10.0000 mg | Freq: Once | INTRAMUSCULAR | Status: AC
  Administered 2019-04-07: 10 mg via INTRAVENOUS
  Filled 2019-04-07: qty 1

## 2019-04-07 MED ORDER — HYDROCODONE-ACETAMINOPHEN 5-325 MG PO TABS: 1.0000 | ORAL_TABLET | ORAL | 0 refills | Status: DC | PRN

## 2019-04-07 MED ORDER — ONDANSETRON HCL 4 MG/2ML IJ SOLN
4.0000 mg | Freq: Once | INTRAMUSCULAR | Status: AC
  Administered 2019-04-07: 19:00:00 4 mg via INTRAVENOUS
  Filled 2019-04-07: qty 2

## 2019-04-07 MED ORDER — MORPHINE SULFATE (PF) 4 MG/ML IV SOLN
4.0000 mg | Freq: Once | INTRAVENOUS | Status: AC
  Administered 2019-04-07: 20:00:00 4 mg via INTRAVENOUS
  Filled 2019-04-07: qty 1

## 2019-04-07 MED ORDER — POTASSIUM CHLORIDE ER 20 MEQ PO TBCR: 40.0000 meq | EXTENDED_RELEASE_TABLET | Freq: Every day | ORAL | 0 refills | Status: DC

## 2019-04-07 MED ORDER — POTASSIUM CHLORIDE CRYS ER 20 MEQ PO TBCR
40.0000 meq | EXTENDED_RELEASE_TABLET | Freq: Once | ORAL | Status: AC
  Administered 2019-04-07: 19:00:00 40 meq via ORAL
  Filled 2019-04-07: qty 2

## 2019-04-07 MED ORDER — MORPHINE SULFATE (PF) 4 MG/ML IV SOLN
4.0000 mg | Freq: Once | INTRAVENOUS | Status: AC
  Administered 2019-04-07: 19:00:00 4 mg via INTRAVENOUS
  Filled 2019-04-07: qty 1

## 2019-04-07 NOTE — ED Notes (Signed)
Refer to triage note: pt reports taking tylenol today at 3pm w/o relief. Pt denies fever/cough. Denies exposure to COVID19. NAD note upon assessment.

## 2019-04-07 NOTE — ED Triage Notes (Signed)
Pt in via POV, complaints of body aches x one week, reports being seen here Saturday and Sunday with no improvement since then.  Ambulatory to triage, occupied with cell phone during triage, NAD noted at this time.
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
## 2019-04-08 DIAGNOSIS — M25511 Pain in right shoulder: Secondary | ICD-10-CM | POA: Diagnosis not present

## 2019-04-08 DIAGNOSIS — Y999 Unspecified external cause status: Secondary | ICD-10-CM | POA: Diagnosis not present

## 2019-04-08 DIAGNOSIS — R52 Pain, unspecified: Secondary | ICD-10-CM | POA: Diagnosis not present

## 2019-04-08 DIAGNOSIS — M25512 Pain in left shoulder: Secondary | ICD-10-CM | POA: Diagnosis not present

## 2019-04-08 DIAGNOSIS — Y92410 Unspecified street and highway as the place of occurrence of the external cause: Secondary | ICD-10-CM | POA: Diagnosis not present

## 2019-04-14 DIAGNOSIS — M3214 Glomerular disease in systemic lupus erythematosus: Secondary | ICD-10-CM | POA: Diagnosis not present

## 2019-04-20 ENCOUNTER — Telehealth: Payer: Self-pay

## 2019-04-20 DIAGNOSIS — M329 Systemic lupus erythematosus, unspecified: Secondary | ICD-10-CM

## 2019-04-20 DIAGNOSIS — M3214 Glomerular disease in systemic lupus erythematosus: Secondary | ICD-10-CM

## 2019-04-20 DIAGNOSIS — IMO0002 Reserved for concepts with insufficient information to code with codable children: Secondary | ICD-10-CM

## 2019-04-20 DIAGNOSIS — Z7960 Long term (current) use of unspecified immunomodulators and immunosuppressants: Secondary | ICD-10-CM

## 2019-04-20 DIAGNOSIS — Z79899 Other long term (current) drug therapy: Secondary | ICD-10-CM

## 2019-04-20 NOTE — Telephone Encounter (Signed)
Order labs per [facility]

## 2019-04-21 DIAGNOSIS — M3214 Glomerular disease in systemic lupus erythematosus: Secondary | ICD-10-CM | POA: Diagnosis not present

## 2019-04-21 DIAGNOSIS — Z79899 Other long term (current) drug therapy: Secondary | ICD-10-CM | POA: Diagnosis not present

## 2019-04-21 DIAGNOSIS — M329 Systemic lupus erythematosus, unspecified: Secondary | ICD-10-CM | POA: Diagnosis not present

## 2019-04-22 LAB — MICROSCOPIC EXAMINATION
Casts: NONE SEEN /lpf
WBC, UA: >30 /hpf — AB (ref 0–5)

## 2019-04-22 LAB — URINALYSIS, ROUTINE W REFLEX MICROSCOPIC
Bilirubin, UA: NEGATIVE
Glucose, UA: NEGATIVE
Ketones, UA: NEGATIVE
Nitrite, UA: NEGATIVE
Specific Gravity, UA: 1.029 (ref 1.005–1.030)
Urobilinogen, Ur: 0.2 mg/dL (ref 0.2–1.0)
pH, UA: 6.5 (ref 5.0–7.5)

## 2019-04-22 LAB — PROTEIN / CREATININE RATIO, URINE
Creatinine, Urine: 159.7 mg/dL
Protein, Ur: 507.6 mg/dL
Protein/Creat Ratio: 3178 mg/g creat — ABNORMAL HIGH (ref 0–200)

## 2019-04-23 NOTE — Telephone Encounter (Signed)
Please fax to provider that requested them

## 2019-04-26 ENCOUNTER — Telehealth: Payer: Self-pay

## 2019-04-26 LAB — C3 COMPLEMENT: Complement C3, Serum: 113 mg/dL (ref 82–167)

## 2019-04-26 LAB — CBC WITH DIFFERENTIAL/PLATELET
Basophils Absolute: 0 10*3/uL (ref 0.0–0.2)
Basos: 0 %
EOS (ABSOLUTE): 0 10*3/uL (ref 0.0–0.4)
Eos: 1 %
Hematocrit: 35.7 % (ref 34.0–46.6)
Hemoglobin: 12.1 g/dL (ref 11.1–15.9)
Immature Grans (Abs): 0 10*3/uL (ref 0.0–0.1)
Immature Granulocytes: 0 %
Lymphocytes Absolute: 0.9 10*3/uL (ref 0.7–3.1)
Lymphs: 26 %
MCH: 30.4 pg (ref 26.6–33.0)
MCHC: 33.9 g/dL (ref 31.5–35.7)
MCV: 90 fL (ref 79–97)
Monocytes Absolute: 0.3 10*3/uL (ref 0.1–0.9)
Monocytes: 8 %
Neutrophils Absolute: 2.3 10*3/uL (ref 1.4–7.0)
Neutrophils: 65 %
Platelets: 262 10*3/uL (ref 150–450)
RBC: 3.98 x10E6/uL (ref 3.77–5.28)
RDW: 14.3 % (ref 11.7–15.4)
WBC: 3.5 10*3/uL (ref 3.4–10.8)

## 2019-04-26 LAB — ALT: ALT: 11 IU/L (ref 0–32)

## 2019-04-26 LAB — CREATININE, SERUM
Creatinine, Ser: 0.63 mg/dL (ref 0.57–1.00)
GFR calc Af Amer: 142 mL/min/{1.73_m2} (ref >59)
GFR calc non Af Amer: 123 mL/min/{1.73_m2} (ref >59)

## 2019-04-26 LAB — TACROLIMUS LEVEL: Tacrolimus Lvl: <1 ng/mL — ABNORMAL LOW (ref 2.0–20.0)

## 2019-04-26 LAB — AST: AST: 14 IU/L (ref 0–40)

## 2019-04-26 LAB — C4 COMPLEMENT: Complement C4, Serum: 35 mg/dL (ref 12–38)

## 2019-04-26 LAB — ANTI-DNA ANTIBODY, DOUBLE-STRANDED: dsDNA Ab: 20 IU/mL — ABNORMAL HIGH (ref 0–9)

## 2019-04-26 NOTE — Telephone Encounter (Signed)
Lab results faxed to Dr. Leonard Downing, [place] at [facility]. [phone]  fax [phone].

## 2019-04-26 NOTE — Telephone Encounter (Signed)
-----   Message from [name], MD sent at 04/23/2019  3:12 PM EST ----- Please fax to provider that requested them

## 2019-05-10 ENCOUNTER — Telehealth: Payer: Self-pay | Admitting: Emergency Medicine

## 2019-05-10 ENCOUNTER — Emergency Department: Payer: Medicaid Other

## 2019-05-10 ENCOUNTER — Encounter: Payer: Self-pay | Admitting: Emergency Medicine

## 2019-05-10 ENCOUNTER — Other Ambulatory Visit: Payer: Self-pay

## 2019-05-10 ENCOUNTER — Emergency Department
Admission: EM | Admit: 2019-05-10 | Discharge: 2019-05-10 | Disposition: A | Discharge status: Home / Self Care | Payer: Medicaid Other | Attending: Emergency Medicine | Admitting: Emergency Medicine

## 2019-05-10 DIAGNOSIS — R197 Diarrhea, unspecified: Secondary | ICD-10-CM | POA: Diagnosis not present

## 2019-05-10 DIAGNOSIS — M3210 Systemic lupus erythematosus, organ or system involvement unspecified: Secondary | ICD-10-CM | POA: Insufficient documentation

## 2019-05-10 DIAGNOSIS — R195 Other fecal abnormalities: Secondary | ICD-10-CM | POA: Insufficient documentation

## 2019-05-10 DIAGNOSIS — I1310 Hypertensive heart and chronic kidney disease without heart failure, with stage 1 through stage 4 chronic kidney disease, or unspecified chronic kidney disease: Secondary | ICD-10-CM | POA: Diagnosis not present

## 2019-05-10 DIAGNOSIS — N189 Chronic kidney disease, unspecified: Secondary | ICD-10-CM | POA: Diagnosis not present

## 2019-05-10 DIAGNOSIS — R519 Headache, unspecified: Secondary | ICD-10-CM | POA: Diagnosis not present

## 2019-05-10 DIAGNOSIS — R109 Unspecified abdominal pain: Secondary | ICD-10-CM | POA: Diagnosis not present

## 2019-05-10 DIAGNOSIS — K529 Noninfective gastroenteritis and colitis, unspecified: Secondary | ICD-10-CM | POA: Insufficient documentation

## 2019-05-10 DIAGNOSIS — Z79899 Other long term (current) drug therapy: Secondary | ICD-10-CM | POA: Insufficient documentation

## 2019-05-10 LAB — C DIFFICILE QUICK SCREEN W PCR REFLEX
C Diff antigen: NEGATIVE
C Diff interpretation: NOT DETECTED
C Diff toxin: NEGATIVE

## 2019-05-10 LAB — COMPREHENSIVE METABOLIC PANEL
ALT: 11 U/L (ref 0–44)
AST: 12 U/L — ABNORMAL LOW (ref 15–41)
Albumin: 3 g/dL — ABNORMAL LOW (ref 3.5–5.0)
Alkaline Phosphatase: 53 U/L (ref 38–126)
Anion gap: 8 (ref 5–15)
BUN: 19 mg/dL (ref 6–20)
CO2: 21 mmol/L — ABNORMAL LOW (ref 22–32)
Calcium: 8.3 mg/dL — ABNORMAL LOW (ref 8.9–10.3)
Chloride: 109 mmol/L (ref 98–111)
Creatinine, Ser: 0.59 mg/dL (ref 0.44–1.00)
GFR calc Af Amer: >60 mL/min (ref >60)
GFR calc non Af Amer: >60 mL/min (ref >60)
Glucose, Bld: 87 mg/dL (ref 70–99)
Potassium: 3.6 mmol/L (ref 3.5–5.1)
Sodium: 138 mmol/L (ref 135–145)
Total Bilirubin: 0.7 mg/dL (ref 0.3–1.2)
Total Protein: 6.2 g/dL — ABNORMAL LOW (ref 6.5–8.1)

## 2019-05-10 LAB — URINALYSIS, COMPLETE (UACMP) WITH MICROSCOPIC
Bilirubin Urine: NEGATIVE
Glucose, UA: NEGATIVE mg/dL
Hgb urine dipstick: NEGATIVE
Ketones, ur: NEGATIVE mg/dL
Nitrite: NEGATIVE
Protein, ur: >=300 mg/dL — AB
Specific Gravity, Urine: 1.02 (ref 1.005–1.030)
pH: 5 (ref 5.0–8.0)

## 2019-05-10 LAB — CBC
HCT: 42.4 % (ref 36.0–46.0)
Hemoglobin: 13.6 g/dL (ref 12.0–15.0)
MCH: 31.2 pg (ref 26.0–34.0)
MCHC: 32.1 g/dL (ref 30.0–36.0)
MCV: 97.2 fL (ref 80.0–100.0)
Platelets: 249 10*3/uL (ref 150–400)
RBC: 4.36 MIL/uL (ref 3.87–5.11)
RDW: 15.5 % (ref 11.5–15.5)
WBC: 5.2 10*3/uL (ref 4.0–10.5)
nRBC: 0 % (ref 0.0–0.2)

## 2019-05-10 LAB — POCT PREGNANCY, URINE: Preg Test, Ur: NEGATIVE

## 2019-05-10 LAB — LIPASE, BLOOD: Lipase: 31 U/L (ref 11–51)

## 2019-05-10 MED ORDER — DICYCLOMINE HCL 10 MG PO CAPS: 10.0000 mg | ORAL_CAPSULE | Freq: Four times a day (QID) | ORAL | 0 refills | Status: DC | PRN

## 2019-05-10 MED ORDER — ONDANSETRON 4 MG PO TBDP: 4.0000 mg | ORAL_TABLET | Freq: Four times a day (QID) | ORAL | 0 refills | Status: AC | PRN

## 2019-05-10 MED ORDER — ONDANSETRON 4 MG PO TBDP
4.0000 mg | ORAL_TABLET | Freq: Once | ORAL | Status: AC
  Administered 2019-05-10: 4 mg via ORAL
  Filled 2019-05-10: qty 1

## 2019-05-10 MED ORDER — ACETAMINOPHEN 500 MG PO TABS
1000.0000 mg | ORAL_TABLET | ORAL | Status: AC
  Administered 2019-05-10: 1000 mg via ORAL
  Filled 2019-05-10: qty 2

## 2019-05-10 MED ORDER — DICYCLOMINE HCL 10 MG PO CAPS
10.0000 mg | ORAL_CAPSULE | Freq: Once | ORAL | Status: AC
  Administered 2019-05-10: 10 mg via ORAL
  Filled 2019-05-10: qty 1

## 2019-05-10 NOTE — Discharge Instructions (Addendum)
You were seen in the emergency room for abdominal pain. It is important that you follow up closely with Dr. Tobi Bastos, please call and his clinic should be able to get you an appointment within the next 1 to 2 days.  Please return to the emergency room right away if you are to develop a fever, severe nausea, your pain becomes severe or worsens, you are unable to keep food down, begin vomiting any dark or bloody fluid, you develop any dark or bloody stools, feel dehydrated, or other new concerns or symptoms arise.
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
## 2019-05-10 NOTE — ED Triage Notes (Signed)
Pt in via POV, complaints of ongoing headache and diarrhea x approximately one week.  Hx of migraines, states, "This doesn't feel like my regular migraine."  Ambulatory to triage, NAD noted at this time.

## 2019-05-10 NOTE — ED Notes (Signed)
Pt given apple juice, ice water, graham crackers and peanut butter per MD Quale order.

## 2019-05-10 NOTE — ED Provider Notes (Signed)
-----------------------------------------   11:47 PM on 05/10/2019 -----------------------------------------  Blood pressure (!) 131/113, pulse 83, temperature 98.2 F (36.8 C), temperature source Oral, resp. rate 17, height 4\' 11"  (1.499 m), weight 81.6 kg, SpO2 100 %.  Assuming care from Dr. .  In short, Faith Camacho is a 29 y.o. female with a chief complaint of Headache and Diarrhea .  Refer to the original H&P for additional details.  The current plan of care is to await C. difficile panel.  Patient presented to the emergency department complaining of headache and diarrhea.  Patient had been assessed by attending physician Dr. 34 and handed off to me pending stool sample results. There was some concern given patient's history of C. difficile in the past, and recent antibiotic use, that this diarrhea could be sequela.  Initially patient was unable to provide a stool sample, however patient was able eventually to provide enough to send to the lab.  Thankfully this returns with negative results.  Patient will have Bentyl, Zofran for symptom control.  Follow-up with GI.  Diagnosis:  Loose stools Headache Colitis  .    Fanny Bien 05/10/19 2350    05/12/19, MD 05/12/19 1247

## 2019-05-10 NOTE — Telephone Encounter (Signed)
Outpatient stool cultures ordered, however patient was just able to submit stool culture in the ER. Notified liz gannon Banker) to follow-up on their results.

## 2019-05-11 ENCOUNTER — Telehealth: Payer: Self-pay

## 2019-05-11 LAB — URINE CULTURE: Special Requests: NORMAL

## 2019-05-11 NOTE — Telephone Encounter (Signed)
Patient was seen in the ER on last night, says she was told to follow-up within two days. Please let me know how to schedule. I don't show any availability until May. I advised patient that I would speak to the provider and have the nurse to let her know when to come in.

## 2019-05-12 LAB — GI PATHOGEN PANEL BY PCR, STOOL

## 2019-05-12 NOTE — ED Provider Notes (Signed)
Reviewed with Dr. Tobi Bastos this morning. Including stool culture Enteropathogenic E coli.  Dr. Tobi Bastos is planning to see the patient tomorrow for a follow-up.   Sharyn Creamer, MD 05/12/19 [phone]

## 2019-05-13 ENCOUNTER — Other Ambulatory Visit: Payer: Self-pay

## 2019-05-13 ENCOUNTER — Ambulatory Visit (INDEPENDENT_AMBULATORY_CARE_PROVIDER_SITE_OTHER): Payer: Medicaid Other | Admitting: Gastroenterology

## 2019-05-13 ENCOUNTER — Telehealth: Payer: Self-pay

## 2019-05-13 VITALS — BP 138/83 | HR 81 | Temp 98.4°F | Ht 59.0 in | Wt 143.2 lb

## 2019-05-13 DIAGNOSIS — A09 Infectious gastroenteritis and colitis, unspecified: Secondary | ICD-10-CM

## 2019-05-13 MED ORDER — AZITHROMYCIN 500 MG PO TABS: 500.0000 mg | ORAL_TABLET | Freq: Every day | ORAL | 0 refills | Status: AC

## 2019-05-13 NOTE — Telephone Encounter (Signed)
Scheduled the patient for a visit. Called several times and was unable to make contact with her

## 2019-05-13 NOTE — Progress Notes (Signed)
Faith Camacho  [address]  [place]  [place], [place] [address]  Main: [phone]  Fax: [phone]   Gastroenterology Consultation  Referring Provider:  Dr Jacqualine Code    Primary Care Physician:  Virginia Crews, MD Reason for Consultation:     Diarrhea         HPI:   History of Present Illness: Diarrhea  Faith Camacho is a 29 y.o. y/o female referred for consultation & management  by Dr. Brita Romp, Dionne Bucy, MD.    The patient presented the emergency room on 05/10/2019 and was evaluated by Dr. Jacqualine Code.He contacted me as the patient was having diarrhea but was unable to provide a stool sample at that point of time.  Was sent home to provide a sample as an outpatient.  At that point of time had diarrhea for ongoing for a week.  History of lupus and on multiple medications for the same.  History of C. difficile diarrhea in the past.  Stool for C. difficile was negative, GI PCR was positive for enteropathogenic E. coli.Hemoglobin 13.6 g with white cell count of 5.2, CMP was normal.  Lipase was normal.  Still continues to have diarrhea 5 bowel movements a day.  Nonbloody.  Generalized abdominal discomfort usually right after she eats.  Denies any NSAID use.  No fevers.  No other sick contacts.  Past Medical History:  Diagnosis Date  . CKD (chronic kidney disease)   . Hypertension   . ILD (interstitial lung disease) ([place])   . Membranous glomerulonephritis   . SLE (systemic lupus erythematosus related syndrome) ([place])     Past Surgical History:  Procedure Laterality Date  . CHOLECYSTECTOMY    . LUNG BIOPSY    . RENAL BIOPSY      Prior to Admission medications   Medication Sig Start Date End Date Taking? Authorizing Provider  albuterol (PROVENTIL HFA;VENTOLIN HFA) 108 (90 Base) MCG/ACT inhaler Inhale 2 puffs into the lungs every 4 (four) hours as needed for wheezing or shortness of breath. 08/01/17   Cuthriell, Charline Bills, PA-C  beclomethasone (QVAR) 80 MCG/ACT inhaler  Inhale 2 puffs into the lungs 2 (two) times daily.    [provider]  calcium citrate-vitamin D (CITRACAL+D) 315-200 MG-UNIT tablet Take 1 tablet by mouth 2 (two) times daily.    [provider]  citalopram (CELEXA) 40 MG tablet Take 1 tablet (40 mg total) by mouth daily. 12/25/18   Bacigalupo, Dionne Bucy, MD  dicyclomine (BENTYL) 10 MG capsule Take 1 capsule (10 mg total) by mouth every 6 (six) hours as needed for up to 4 days for spasms. 05/10/19 05/14/19  Delman Kitten, MD  Erenumab-aooe (AIMOVIG) 140 MG/ML SOAJ Inject 140 mg into the skin every 30 (thirty) days. 09/16/17   Pieter [place], DO  gabapentin (NEURONTIN) 300 MG capsule Take 300 mg by mouth 2 (two) times daily.     [provider]  HYDROcodone-acetaminophen (NORCO/VICODIN) 5-325 MG tablet Take 1 tablet by mouth every 4 (four) hours as needed for moderate pain. 04/07/19   Cuthriell, Charline Bills, PA-C  hydrOXYzine (ATARAX/VISTARIL) 10 MG tablet Take 1 tablet (10 mg total) by mouth 3 (three) times daily as needed for anxiety. 03/13/18   Virginia Crews, MD  losartan (COZAAR) 100 MG tablet Take 100 mg by mouth daily.    [provider]  mycophenolate (CELLCEPT) 500 MG tablet Take 1,500 mg by mouth 2 (two) times daily.    [provider]  omeprazole (Santa Claus)  20 MG capsule Take 20 mg by mouth daily.    [provider]  ondansetron (ZOFRAN ODT) 4 MG disintegrating tablet Take 1 tablet (4 mg total) by mouth every 6 (six) hours as needed for nausea or vomiting. 05/10/19   Sharyn Creamer, MD  potassium chloride (K-DUR) 10 MEQ tablet Take 10 mEq by mouth daily.    [provider]  sodium bicarbonate 325 MG tablet Take 325 mg by mouth as needed.     [provider]  tacrolimus (PROGRAF) 0.5 MG capsule Take 0.5 mg by mouth 2 (two) times daily.  10/12/18 10/12/19  [provider]  rizatriptan (MAXALT) 5 MG tablet Take 1 tablet earliest onset of migraine.  May repeat x1 in 2 hours if  needed 09/16/17 10/22/18  Drema [place], DO    Family History  Problem Relation Age of Onset  . Hypertension Maternal Grandmother   . Healthy Mother   . Hypertension Father   . Hypertension Maternal Grandfather   . Diabetes Maternal Grandfather   . Hypertension Paternal Grandmother   . Colon cancer Neg Hx   . Breast cancer Neg Hx      Social History   Tobacco Use  . Smoking status: Never Smoker  . Smokeless tobacco: Never Used  Substance Use Topics  . Alcohol use: Yes    Alcohol/week: 1.0 standard drinks    Types: 1 Glasses of wine per week  . Drug use: No    Allergies as of 05/13/2019 - Review Complete 05/10/2019  Allergen Reaction Noted  . Fentanyl and related Hives and Itching 04/07/2019  . Ibuprofen Other (See Comments) 09/16/2017    Review of Systems:    All systems reviewed and negative except where noted in HPI.  There were no vitals taken for this visit.  General Appearance:    Alert, cooperative, no distress, appears stated age  Head:    Normocephalic, without obvious abnormality, atraumatic  Eyes:    PERRL, conjunctiva/corneas clear, EOM's intact, fundi    benign, both eyes                             Abdomen:     Soft, non-tender, bowel sounds active all four quadrants,    no masses, no organomegaly                    Neurologic:   CNII-XII intact, normal strength, sensation and reflexes    throughout    Observations/Objective:  Labs: CBC    Component Value Date/Time   WBC 5.2 05/10/2019 1750   RBC 4.36 05/10/2019 1750   HGB 13.6 05/10/2019 1750   HGB 12.1 04/21/2019 1556   HCT 42.4 05/10/2019 1750   HCT 35.7 04/21/2019 1556   PLT 249 05/10/2019 1750   PLT 262 04/21/2019 1556   MCV 97.2 05/10/2019 1750   MCV 90 04/21/2019 1556   MCH 31.2 05/10/2019 1750   MCHC 32.1 05/10/2019 1750   RDW 15.5 05/10/2019 1750   RDW 14.3 04/21/2019 1556   LYMPHSABS 0.9 04/21/2019 1556   MONOABS 0.3 04/07/2019 1831   EOSABS 0.0 04/21/2019 1556    BASOSABS 0.0 04/21/2019 1556   CMP     Component Value Date/Time   NA 138 05/10/2019 1750   K 3.6 05/10/2019 1750   CL 109 05/10/2019 1750   CO2 21 (L) 05/10/2019 1750   GLUCOSE 87 05/10/2019 1750   BUN 19 05/10/2019 1750  CREATININE 0.59 05/10/2019 1750   CALCIUM 8.3 (L) 05/10/2019 1750   PROT 6.2 (L) 05/10/2019 1750   ALBUMIN 3.0 (L) 05/10/2019 1750   AST 12 (L) 05/10/2019 1750   ALT 11 05/10/2019 1750   ALKPHOS 53 05/10/2019 1750   BILITOT 0.7 05/10/2019 1750   GFRNONAA >60 05/10/2019 1750   GFRAA >60 05/10/2019 1750    Imaging Studies: DG Abd 2 Views  Result Date: 05/10/2019 CLINICAL DATA:  Abdominal pain and diarrhea EXAM: ABDOMEN - 2 VIEW COMPARISON:  None. FINDINGS: Scattered large and small bowel gas is noted. No abnormal mass or abnormal calcifications are seen. Changes of prior cholecystectomy are noted. No free air is noted. No acute bony abnormality is seen. IMPRESSION: No acute abnormality noted. Electronically Signed   By: Alcide Clever M.D.   On: 05/10/2019 19:42    Assessment and Plan:   Faith Camacho is a 29 y.o. y/o female has been referred for acute diarrhea.  She is immunocompromised as she has lupus.  Labs were normal except a stool test that came positive for enteropathogenic E. Coli.  Since the diarrhea has persisted despite 5 to 7 days of conservative treatment and since she is immunocompromised I believe we should proceed with giving her a course of antibiotics.  Plan 1.  Supportive therapy with adequate rehydration avoiding NSAIDs. 2.  Azithromycin 500 mg once a day for a total of 3 days i.e. 3 doses  Return back to the office if no better.     I discussed the assessment and treatment plan with the patient. The patient was provided an opportunity to ask questions and all were answered. The patient agreed with the plan and demonstrated an understanding of the instructions.   The patient was advised to call back or seek an in-person evaluation if  the symptoms worsen or if the condition fails to improve as anticipated.    Dr [name] MD,MRCP [facility]) Gastroenterology/Hepatology Pager: [phone]   Speech recognition software was used to dictate the above note.

## 2019-05-13 NOTE — Telephone Encounter (Signed)
Called pt to pre-chart for today's e-visit with Dr. Robbie Lis to contact. VM unavailable.

## 2019-05-15 LAB — STOOL CULTURE REFLEX - CMPCXR

## 2019-05-15 LAB — STOOL CULTURE: E coli, Shiga toxin Assay: NEGATIVE

## 2019-05-15 LAB — STOOL CULTURE REFLEX - RSASHR

## 2019-06-02 ENCOUNTER — Other Ambulatory Visit: Payer: Self-pay

## 2019-06-02 ENCOUNTER — Emergency Department
Admission: EM | Admit: 2019-06-02 | Discharge: 2019-06-02 | Disposition: A | Discharge status: Home / Self Care | Payer: Medicaid Other | Attending: Emergency Medicine | Admitting: Emergency Medicine

## 2019-06-02 DIAGNOSIS — R197 Diarrhea, unspecified: Secondary | ICD-10-CM | POA: Diagnosis not present

## 2019-06-02 DIAGNOSIS — R1084 Generalized abdominal pain: Secondary | ICD-10-CM | POA: Diagnosis not present

## 2019-06-02 DIAGNOSIS — Z79899 Other long term (current) drug therapy: Secondary | ICD-10-CM | POA: Diagnosis not present

## 2019-06-02 DIAGNOSIS — I129 Hypertensive chronic kidney disease with stage 1 through stage 4 chronic kidney disease, or unspecified chronic kidney disease: Secondary | ICD-10-CM | POA: Insufficient documentation

## 2019-06-02 DIAGNOSIS — A09 Infectious gastroenteritis and colitis, unspecified: Secondary | ICD-10-CM | POA: Diagnosis not present

## 2019-06-02 DIAGNOSIS — N182 Chronic kidney disease, stage 2 (mild): Secondary | ICD-10-CM | POA: Diagnosis not present

## 2019-06-02 DIAGNOSIS — R109 Unspecified abdominal pain: Secondary | ICD-10-CM | POA: Diagnosis present

## 2019-06-02 LAB — CBC
HCT: 42.8 % (ref 36.0–46.0)
Hemoglobin: 13.8 g/dL (ref 12.0–15.0)
MCH: 31 pg (ref 26.0–34.0)
MCHC: 32.2 g/dL (ref 30.0–36.0)
MCV: 96.2 fL (ref 80.0–100.0)
Platelets: 213 10*3/uL (ref 150–400)
RBC: 4.45 MIL/uL (ref 3.87–5.11)
RDW: 14.1 % (ref 11.5–15.5)
WBC: 3.2 10*3/uL — ABNORMAL LOW (ref 4.0–10.5)
nRBC: 0 % (ref 0.0–0.2)

## 2019-06-02 LAB — COMPREHENSIVE METABOLIC PANEL
ALT: 11 U/L (ref 0–44)
AST: 16 U/L (ref 15–41)
Albumin: 2.9 g/dL — ABNORMAL LOW (ref 3.5–5.0)
Alkaline Phosphatase: 62 U/L (ref 38–126)
Anion gap: 8 (ref 5–15)
BUN: 12 mg/dL (ref 6–20)
CO2: 23 mmol/L (ref 22–32)
Calcium: 8 mg/dL — ABNORMAL LOW (ref 8.9–10.3)
Chloride: 109 mmol/L (ref 98–111)
Creatinine, Ser: 0.64 mg/dL (ref 0.44–1.00)
GFR calc Af Amer: >60 mL/min (ref >60)
GFR calc non Af Amer: >60 mL/min (ref >60)
Glucose, Bld: 58 mg/dL — ABNORMAL LOW (ref 70–99)
Potassium: 3 mmol/L — ABNORMAL LOW (ref 3.5–5.1)
Sodium: 140 mmol/L (ref 135–145)
Total Bilirubin: 0.6 mg/dL (ref 0.3–1.2)
Total Protein: 5.9 g/dL — ABNORMAL LOW (ref 6.5–8.1)

## 2019-06-02 LAB — LIPASE, BLOOD: Lipase: 36 U/L (ref 11–51)

## 2019-06-02 MED ORDER — HYDROCODONE-ACETAMINOPHEN 5-325 MG PO TABS
1.0000 | ORAL_TABLET | Freq: Once | ORAL | Status: AC
  Administered 2019-06-02: 19:00:00 1 via ORAL
  Filled 2019-06-02: qty 1

## 2019-06-02 NOTE — ED Triage Notes (Signed)
Pt comes POV for lower abdominal pain and diarrhea for 1 week. Pt states that she was recently told "I have e coli in my stool" and she's having trouble keeping stuff down.
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
## 2019-06-03 ENCOUNTER — Other Ambulatory Visit: Payer: Self-pay

## 2019-06-03 ENCOUNTER — Ambulatory Visit (INDEPENDENT_AMBULATORY_CARE_PROVIDER_SITE_OTHER): Payer: Medicaid Other | Admitting: Gastroenterology

## 2019-06-03 VITALS — BP 139/93 | HR 76 | Temp 98.7°F | Ht 59.0 in | Wt 146.4 lb

## 2019-06-03 DIAGNOSIS — R197 Diarrhea, unspecified: Secondary | ICD-10-CM | POA: Diagnosis not present

## 2019-06-03 DIAGNOSIS — A09 Infectious gastroenteritis and colitis, unspecified: Secondary | ICD-10-CM

## 2019-06-03 NOTE — Progress Notes (Signed)
[name] MD, MRCP(U.K) [address]  [address]  [place], [place] [address]  Main: [phone]  Fax: [phone]   Primary Care Physician: [name], MD  Primary Gastroenterologist:  Dr. [name]   Follow-up for diarrhea and abdominal pain.  HPI: Faith Camacho is a 29 y.o. female    Summary of history :  She was initially seen and referred on 05/13/2019.  She presented to the emergency room on 05/10/2019 for diarrhea and was unable to provide a stool sample at that time.  The diarrhea has been ongoing for a week.  History of lupus.  History of C. difficile diarrhea in the past.  She was negative for C. difficile diarrhea but GI PCR was positive for enteropathogenic E. coli.  Treated with a course of azithromycin.  Interval history 05/13/2019-06/03/2019.  She presented to the emergency room yesterday i.e. 06/02/2019 with abdominal pain and diarrhea.  This was after she was treated with azithromycin for the E. coli diarrhea, initially felt better but gradually the symptoms began to return.  The emergency room contacted me yesterday and she was unable to provide a sample of the stool for testing.  Hemoglobin was 13.8 g with a white cell count of 3.2, CMP demonstrated a potassium of 3.0 and albumin of 2.9.  Glucose of 58.  Lipase was normal.  Presently has 3 bowel movements per day watery no blood in it generalized mild abdominal pain preceding a bowel movement.     Current Outpatient Medications  Medication Sig Dispense Refill  . albuterol (PROVENTIL HFA;VENTOLIN HFA) 108 (90 Base) MCG/ACT inhaler Inhale 2 puffs into the lungs every 4 (four) hours as needed for wheezing or shortness of breath. 1 Inhaler 0  . beclomethasone (QVAR) 80 MCG/ACT inhaler Inhale 2 puffs into the lungs 2 (two) times daily.    . calcium citrate-vitamin D (CITRACAL+D) 315-200 MG-UNIT tablet Take 1 tablet by mouth 2 (two) times daily.    . citalopram (CELEXA) 40 MG tablet Take 1 tablet (40 mg  total) by mouth daily. 30 tablet 3  . dicyclomine (BENTYL) 10 MG capsule Take 1 capsule (10 mg total) by mouth every 6 (six) hours as needed for up to 4 days for spasms. 16 capsule 0  . Erenumab-aooe (AIMOVIG) 140 MG/ML SOAJ Inject 140 mg into the skin every 30 (thirty) days. 1 pen 11  . gabapentin (NEURONTIN) 300 MG capsule Take 300 mg by mouth 2 (two) times daily.     Marland Kitchen HYDROcodone-acetaminophen (NORCO/VICODIN) 5-325 MG tablet Take 1 tablet by mouth every 4 (four) hours as needed for moderate pain. 10 tablet 0  . hydrOXYzine (ATARAX/VISTARIL) 10 MG tablet Take 1 tablet (10 mg total) by mouth 3 (three) times daily as needed for anxiety. 30 tablet 2  . losartan (COZAAR) 100 MG tablet Take 100 mg by mouth daily.    . mycophenolate (CELLCEPT) 500 MG tablet Take 1,500 mg by mouth 2 (two) times daily.    Marland Kitchen omeprazole (PRILOSEC) 20 MG capsule Take 20 mg by mouth daily.    . ondansetron (ZOFRAN ODT) 4 MG disintegrating tablet Take 1 tablet (4 mg total) by mouth every 6 (six) hours as needed for nausea or vomiting. 20 tablet 0  . potassium chloride (K-DUR) 10 MEQ tablet Take 10 mEq by mouth daily.    . sodium bicarbonate 325 MG tablet Take 325 mg by mouth as needed.     . tacrolimus (PROGRAF) 0.5 MG capsule Take 0.5 mg by mouth 2 (  two) times daily.      No current facility-administered medications for this visit.    Allergies as of 06/03/2019 - Review Complete 06/02/2019  Allergen Reaction Noted  . Fentanyl and related Hives and Itching 04/07/2019  . Ibuprofen Other (See Comments) 09/16/2017    ROS:  General: Negative for anorexia, weight loss, fever, chills, fatigue, weakness. ENT: Negative for hoarseness, difficulty swallowing , nasal congestion. CV: Negative for chest pain, angina, palpitations, dyspnea on exertion, peripheral edema.  Respiratory: Negative for dyspnea at rest, dyspnea on exertion, cough, sputum, wheezing.  GI: See history of present illness. GU:  Negative for dysuria,  hematuria, urinary incontinence, urinary frequency, nocturnal urination.  Endo: Negative for unusual weight change.    Physical Examination:   LMP 05/13/2019   General: Well-nourished, well-developed in no acute distress.  Eyes: No icterus. Conjunctivae pink. Abdomen: Bowel sounds are normal, nontender, nondistended, no hepatosplenomegaly or masses, no abdominal bruits or hernia , no rebound or guarding.   Extremities: No lower extremity edema. No clubbing or deformities. Neuro: Alert and oriented x 3.  Grossly intact. Psych: Alert and cooperative, normal mood and affect.   Imaging Studies: DG Abd 2 Views  Result Date: 05/10/2019 CLINICAL DATA:  Abdominal pain and diarrhea EXAM: ABDOMEN - 2 VIEW COMPARISON:  None. FINDINGS: Scattered large and small bowel gas is noted. No abnormal mass or abnormal calcifications are seen. Changes of prior cholecystectomy are noted. No free air is noted. No acute bony abnormality is seen. IMPRESSION: No acute abnormality noted. Electronically Signed   By: Inez Catalina M.D.   On: 05/10/2019 19:42    Assessment and Plan:   Faith Camacho is a 29 y.o. y/o female here to see me for recurrence of diarrhea.  Recent history of C. difficile diarrhea followed by an episode of E. coli.  S/p treatment for both with antibiotics.  Plan 1.  Suggest to rehydrate with consuming oral fluids such as Gatorade.  Avoid consuming any diet sodas artificial sweeteners and would also avoid lactose-containing products till she feels better.  2.  Provide stool sample to rule out recurrence of E. coli or C. difficile diarrhea.  Dr Jonathon Bellows  MD,MRCP [facility]) Follow up in 4 weeks telephone visit

## 2019-06-04 DIAGNOSIS — A09 Infectious gastroenteritis and colitis, unspecified: Secondary | ICD-10-CM | POA: Diagnosis not present

## 2019-06-06 ENCOUNTER — Other Ambulatory Visit: Payer: Self-pay

## 2019-06-06 ENCOUNTER — Emergency Department: Payer: Medicaid Other

## 2019-06-06 ENCOUNTER — Emergency Department
Admission: EM | Admit: 2019-06-06 | Discharge: 2019-06-06 | Disposition: A | Discharge status: Home / Self Care | Payer: Medicaid Other | Attending: Emergency Medicine | Admitting: Emergency Medicine

## 2019-06-06 DIAGNOSIS — N182 Chronic kidney disease, stage 2 (mild): Secondary | ICD-10-CM | POA: Diagnosis not present

## 2019-06-06 DIAGNOSIS — R0602 Shortness of breath: Secondary | ICD-10-CM

## 2019-06-06 DIAGNOSIS — R05 Cough: Secondary | ICD-10-CM | POA: Diagnosis not present

## 2019-06-06 DIAGNOSIS — Z79899 Other long term (current) drug therapy: Secondary | ICD-10-CM | POA: Insufficient documentation

## 2019-06-06 DIAGNOSIS — R197 Diarrhea, unspecified: Secondary | ICD-10-CM | POA: Diagnosis not present

## 2019-06-06 DIAGNOSIS — I129 Hypertensive chronic kidney disease with stage 1 through stage 4 chronic kidney disease, or unspecified chronic kidney disease: Secondary | ICD-10-CM | POA: Insufficient documentation

## 2019-06-06 DIAGNOSIS — R1010 Upper abdominal pain, unspecified: Secondary | ICD-10-CM | POA: Diagnosis not present

## 2019-06-06 DIAGNOSIS — Z20822 Contact with and (suspected) exposure to covid-19: Secondary | ICD-10-CM | POA: Insufficient documentation

## 2019-06-06 LAB — CBC
HCT: 41.2 % (ref 36.0–46.0)
Hemoglobin: 13.6 g/dL (ref 12.0–15.0)
MCH: 31.3 pg (ref 26.0–34.0)
MCHC: 33 g/dL (ref 30.0–36.0)
MCV: 94.7 fL (ref 80.0–100.0)
Platelets: 217 10*3/uL (ref 150–400)
RBC: 4.35 MIL/uL (ref 3.87–5.11)
RDW: 13.6 % (ref 11.5–15.5)
WBC: 3.5 10*3/uL — ABNORMAL LOW (ref 4.0–10.5)
nRBC: 0 % (ref 0.0–0.2)

## 2019-06-06 LAB — COMPREHENSIVE METABOLIC PANEL
ALT: 8 U/L (ref 0–44)
AST: 15 U/L (ref 15–41)
Albumin: 2.7 g/dL — ABNORMAL LOW (ref 3.5–5.0)
Alkaline Phosphatase: 59 U/L (ref 38–126)
Anion gap: 6 (ref 5–15)
BUN: 12 mg/dL (ref 6–20)
CO2: 25 mmol/L (ref 22–32)
Calcium: 7.9 mg/dL — ABNORMAL LOW (ref 8.9–10.3)
Chloride: 109 mmol/L (ref 98–111)
Creatinine, Ser: 0.69 mg/dL (ref 0.44–1.00)
GFR calc Af Amer: >60 mL/min (ref >60)
GFR calc non Af Amer: >60 mL/min (ref >60)
Glucose, Bld: 84 mg/dL (ref 70–99)
Potassium: 3.3 mmol/L — ABNORMAL LOW (ref 3.5–5.1)
Sodium: 140 mmol/L (ref 135–145)
Total Bilirubin: 0.6 mg/dL (ref 0.3–1.2)
Total Protein: 6.1 g/dL — ABNORMAL LOW (ref 6.5–8.1)

## 2019-06-06 LAB — URINALYSIS, COMPLETE (UACMP) WITH MICROSCOPIC
Bacteria, UA: NONE SEEN
Bilirubin Urine: NEGATIVE
Glucose, UA: NEGATIVE mg/dL
Ketones, ur: NEGATIVE mg/dL
Nitrite: NEGATIVE
Protein, ur: >300 mg/dL — AB
Specific Gravity, Urine: >1.03 — ABNORMAL HIGH (ref 1.005–1.030)
pH: 7 (ref 5.0–8.0)

## 2019-06-06 LAB — RESPIRATORY PANEL BY RT PCR (FLU A&B, COVID)
Influenza A by PCR: NEGATIVE
Influenza B by PCR: NEGATIVE
SARS Coronavirus 2 by RT PCR: NEGATIVE

## 2019-06-06 LAB — POCT PREGNANCY, URINE: Preg Test, Ur: NEGATIVE

## 2019-06-06 LAB — LIPASE, BLOOD: Lipase: 35 U/L (ref 11–51)

## 2019-06-06 MED ORDER — IOHEXOL 9 MG/ML PO SOLN
500.0000 mL | Freq: Two times a day (BID) | ORAL | Status: DC | PRN
  Administered 2019-06-06: 500 mL via ORAL

## 2019-06-06 MED ORDER — IOHEXOL 300 MG/ML  SOLN: 100.0000 mL | Freq: Once | INTRAMUSCULAR | Status: DC | PRN

## 2019-06-06 MED ORDER — ALBUTEROL SULFATE (2.5 MG/3ML) 0.083% IN NEBU
2.5000 mg | INHALATION_SOLUTION | Freq: Once | RESPIRATORY_TRACT | Status: AC
  Administered 2019-06-06: 2.5 mg via RESPIRATORY_TRACT
  Filled 2019-06-06: qty 3

## 2019-06-06 MED ORDER — AZITHROMYCIN 250 MG PO TABS
ORAL_TABLET | ORAL | 0 refills | Status: DC
Start: 2019-06-06 — End: 2019-06-28

## 2019-06-06 MED ORDER — HYDROCOD POLST-CPM POLST ER 10-8 MG/5ML PO SUER
5.0000 mL | Freq: Once | ORAL | Status: AC
  Administered 2019-06-06: 5 mL via ORAL
  Filled 2019-06-06: qty 5

## 2019-06-06 MED ORDER — MORPHINE SULFATE (PF) 4 MG/ML IV SOLN
4.0000 mg | Freq: Once | INTRAVENOUS | Status: AC
  Administered 2019-06-06: 4 mg via INTRAVENOUS
  Filled 2019-06-06: qty 1

## 2019-06-06 MED ORDER — HYDROCOD POLST-CPM POLST ER 10-8 MG/5ML PO SUER: 5.0000 mL | Freq: Two times a day (BID) | ORAL | 0 refills | Status: DC

## 2019-06-06 NOTE — ED Notes (Signed)
Lab called to draw blood after attempt by 2 people unsuccessful
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
## 2019-06-06 NOTE — ED Triage Notes (Signed)
Pt reports that last week she was in ED for N/V/D - she reports that this month she had c-diff - pt continues with loose stools (2 since this am) - Pt c/o lower abd pain, SHOB with cough, diarrhea

## 2019-06-06 NOTE — ED Notes (Signed)
Called CT to come get this pt.

## 2019-06-06 NOTE — ED Provider Notes (Signed)
IMPRESSION: 1. Findings likely represent mild enteritis. Clinical correlation is recommended. No bowel obstruction. Normal appendix. 2. Mildly enlarged and rounded left iliac chain lymph nodes, likely reactive. 3. Partially visualized small bilateral pleural effusions. Emphysema. 4. 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  Patient is in no acute distress, work-up has been largely negative.  I will prescribe cough medicine.  I am hesitant to prescribe antibiotics because of her history of C. difficile colitis.   Emily Filbert, MD 06/06/19 1745

## 2019-06-07 ENCOUNTER — Encounter: Payer: Self-pay | Admitting: Family Medicine

## 2019-06-07 ENCOUNTER — Telehealth: Payer: Self-pay | Admitting: Family Medicine

## 2019-06-07 DIAGNOSIS — R0602 Shortness of breath: Secondary | ICD-10-CM

## 2019-06-07 DIAGNOSIS — R05 Cough: Secondary | ICD-10-CM

## 2019-06-07 DIAGNOSIS — R059 Cough, unspecified: Secondary | ICD-10-CM

## 2019-06-07 NOTE — Telephone Encounter (Signed)
Patient is calling to ask Dr. B is she can prescribe something something that medicaid would cover. She was prescribed  chlorpheniramine-HYDROcodone [facility]) 10-8 MG/5ML SUER [[id number]] in the ED last night. Please advise Preferred Pharmacy- [facility] [place], [place]

## 2019-06-07 NOTE — Telephone Encounter (Signed)
Tessalon perles 100mg  BID prn #30 r0 - ok to ERx

## 2019-06-07 NOTE — Telephone Encounter (Signed)
Patient was seen in ER yesterday

## 2019-06-07 NOTE — Telephone Encounter (Signed)
Please advise 

## 2019-06-08 ENCOUNTER — Telehealth: Payer: Self-pay

## 2019-06-08 MED ORDER — BENZONATATE 100 MG PO CAPS: 100.0000 mg | ORAL_CAPSULE | Freq: Two times a day (BID) | ORAL | 0 refills | Status: DC | PRN

## 2019-06-08 NOTE — Telephone Encounter (Signed)
See phone message about the same thing

## 2019-06-08 NOTE — Telephone Encounter (Signed)
Spoke with pt and informed her of lab results. Pt states her symptoms have improved she's no longer experiencing diarrhea.

## 2019-06-08 NOTE — Telephone Encounter (Signed)
Sent in

## 2019-06-08 NOTE — Progress Notes (Signed)
Jadijah inform patient - she has norovirus in her stool- no medications since its a virus . Should resolve on its own .   Check how she is doing- can try a BRAT diet to decrease diarrhea- some imodium occasionally.   [email or web address]  C/c Bacigalupo, Marzella Schlein, MD   Dr [name] MD,MRCP [facility]) Gastroenterology/Hepatology Pager: [phone]

## 2019-06-08 NOTE — Telephone Encounter (Signed)
-----   Message from [name], MD sent at 06/08/2019  8:36 AM EDT ----- Faith Camacho inform patient - she has norovirus in her stool- no medications since its a virus . Should resolve on its own .   Check how she is doing- can try a BRAT diet to decrease diarrhea- some imodium occasionally.   [email or web address]  C/c Bacigalupo, Marzella Schlein, MD   Dr [name] MD,MRCP [facility]) Gastroenterology/Hepatology Pager: [phone]

## 2019-06-08 NOTE — Addendum Note (Signed)
Addended by: Marjie Skiff on: 06/08/2019 07:13 AM   Modules accepted: Orders

## 2019-06-09 LAB — C DIFFICILE TOXINS A+B W/RFLX: C difficile Toxins A+B, EIA: NEGATIVE

## 2019-06-09 LAB — GI PROFILE, STOOL, PCR

## 2019-06-09 LAB — C DIFFICILE, CYTOTOXIN B

## 2019-06-28 ENCOUNTER — Encounter: Payer: Self-pay | Admitting: Emergency Medicine

## 2019-06-28 ENCOUNTER — Emergency Department
Admission: EM | Admit: 2019-06-28 | Discharge: 2019-06-28 | Disposition: A | Discharge status: Home / Self Care | Payer: Medicaid Other | Attending: Emergency Medicine | Admitting: Emergency Medicine

## 2019-06-28 ENCOUNTER — Other Ambulatory Visit: Payer: Self-pay

## 2019-06-28 ENCOUNTER — Ambulatory Visit (INDEPENDENT_AMBULATORY_CARE_PROVIDER_SITE_OTHER): Payer: Medicaid Other | Admitting: Family Medicine

## 2019-06-28 ENCOUNTER — Emergency Department: Payer: Medicaid Other

## 2019-06-28 DIAGNOSIS — I10 Essential (primary) hypertension: Secondary | ICD-10-CM | POA: Diagnosis not present

## 2019-06-28 DIAGNOSIS — I129 Hypertensive chronic kidney disease with stage 1 through stage 4 chronic kidney disease, or unspecified chronic kidney disease: Secondary | ICD-10-CM | POA: Insufficient documentation

## 2019-06-28 DIAGNOSIS — N182 Chronic kidney disease, stage 2 (mild): Secondary | ICD-10-CM | POA: Insufficient documentation

## 2019-06-28 DIAGNOSIS — R062 Wheezing: Secondary | ICD-10-CM | POA: Insufficient documentation

## 2019-06-28 DIAGNOSIS — R0602 Shortness of breath: Secondary | ICD-10-CM

## 2019-06-28 DIAGNOSIS — Z79899 Other long term (current) drug therapy: Secondary | ICD-10-CM | POA: Insufficient documentation

## 2019-06-28 DIAGNOSIS — R0902 Hypoxemia: Secondary | ICD-10-CM | POA: Diagnosis not present

## 2019-06-28 DIAGNOSIS — R069 Unspecified abnormalities of breathing: Secondary | ICD-10-CM | POA: Diagnosis not present

## 2019-06-28 DIAGNOSIS — Z20822 Contact with and (suspected) exposure to covid-19: Secondary | ICD-10-CM | POA: Diagnosis not present

## 2019-06-28 DIAGNOSIS — R05 Cough: Secondary | ICD-10-CM | POA: Diagnosis not present

## 2019-06-28 DIAGNOSIS — R0689 Other abnormalities of breathing: Secondary | ICD-10-CM | POA: Diagnosis not present

## 2019-06-28 LAB — CBC WITH DIFFERENTIAL/PLATELET
Abs Immature Granulocytes: 0.01 10*3/uL (ref 0.00–0.07)
Basophils Absolute: 0 10*3/uL (ref 0.0–0.1)
Basophils Relative: 0 %
Eosinophils Absolute: 0 10*3/uL (ref 0.0–0.5)
Eosinophils Relative: 0 %
HCT: 40.7 % (ref 36.0–46.0)
Hemoglobin: 13.2 g/dL (ref 12.0–15.0)
Immature Granulocytes: 0 %
Lymphocytes Relative: 29 %
Lymphs Abs: 1.1 10*3/uL (ref 0.7–4.0)
MCH: 31.1 pg (ref 26.0–34.0)
MCHC: 32.4 g/dL (ref 30.0–36.0)
MCV: 95.8 fL (ref 80.0–100.0)
Monocytes Absolute: 0.3 10*3/uL (ref 0.1–1.0)
Monocytes Relative: 7 %
Neutro Abs: 2.4 10*3/uL (ref 1.7–7.7)
Neutrophils Relative %: 64 %
Platelets: 194 10*3/uL (ref 150–400)
RBC: 4.25 MIL/uL (ref 3.87–5.11)
RDW: 13.6 % (ref 11.5–15.5)
Smear Review: NORMAL
WBC: 3.9 10*3/uL — ABNORMAL LOW (ref 4.0–10.5)
nRBC: 0 % (ref 0.0–0.2)

## 2019-06-28 LAB — COMPREHENSIVE METABOLIC PANEL
ALT: 10 U/L (ref 0–44)
AST: 14 U/L — ABNORMAL LOW (ref 15–41)
Albumin: 2.2 g/dL — ABNORMAL LOW (ref 3.5–5.0)
Alkaline Phosphatase: 57 U/L (ref 38–126)
Anion gap: 4 — ABNORMAL LOW (ref 5–15)
BUN: 11 mg/dL (ref 6–20)
CO2: 26 mmol/L (ref 22–32)
Calcium: 8.1 mg/dL — ABNORMAL LOW (ref 8.9–10.3)
Chloride: 109 mmol/L (ref 98–111)
Creatinine, Ser: 0.62 mg/dL (ref 0.44–1.00)
GFR calc Af Amer: >60 mL/min (ref >60)
GFR calc non Af Amer: >60 mL/min (ref >60)
Glucose, Bld: 87 mg/dL (ref 70–99)
Potassium: 3.1 mmol/L — ABNORMAL LOW (ref 3.5–5.1)
Sodium: 139 mmol/L (ref 135–145)
Total Bilirubin: 0.6 mg/dL (ref 0.3–1.2)
Total Protein: 5.5 g/dL — ABNORMAL LOW (ref 6.5–8.1)

## 2019-06-28 MED ORDER — BENZONATATE 100 MG PO CAPS
100.0000 mg | ORAL_CAPSULE | Freq: Three times a day (TID) | ORAL | 0 refills | Status: DC | PRN
Start: 2019-06-28 — End: 2019-06-28

## 2019-06-28 MED ORDER — HYDROCOD POLST-CPM POLST ER 10-8 MG/5ML PO SUER: 5.0000 mL | Freq: Two times a day (BID) | ORAL | 0 refills | Status: DC | PRN

## 2019-06-28 MED ORDER — IPRATROPIUM-ALBUTEROL 0.5-2.5 (3) MG/3ML IN SOLN
3.0000 mL | Freq: Once | RESPIRATORY_TRACT | Status: AC
  Administered 2019-06-28: 3 mL via RESPIRATORY_TRACT
  Filled 2019-06-28: qty 3

## 2019-06-28 MED ORDER — HYDROCOD POLST-CPM POLST ER 10-8 MG/5ML PO SUER
5.0000 mL | Freq: Once | ORAL | Status: AC
  Administered 2019-06-28: 5 mL via ORAL
  Filled 2019-06-28: qty 5

## 2019-06-28 MED ORDER — AZITHROMYCIN 250 MG PO TABS: ORAL_TABLET | ORAL | 0 refills | Status: AC

## 2019-06-28 MED ORDER — BENZONATATE 100 MG PO CAPS
100.0000 mg | ORAL_CAPSULE | Freq: Three times a day (TID) | ORAL | 0 refills | Status: AC | PRN
Start: 2019-06-28 — End: 2019-07-05

## 2019-06-28 MED ORDER — CEFTRIAXONE SODIUM 1 G IJ SOLR
1.0000 g | Freq: Once | INTRAMUSCULAR | Status: AC
  Administered 2019-06-28: 1 g via INTRAMUSCULAR
  Filled 2019-06-28: qty 10

## 2019-06-28 NOTE — ED Triage Notes (Signed)
Arrives via [facility] from home with c/o SOB since Thursday.  History of Asthma, used inhaler with no relief.  Breath sounds deiminished through out.  SpO2 98%  VS wnl.  Inhaler last used around 1500.  Patient AAOx3.  Skin warm and dry. NAD.Marland Kitchen No SOB/ DOE.  Voice clear and strong.
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
## 2019-06-28 NOTE — Progress Notes (Signed)
Virtual telephone visit    Virtual Visit via Telephone Note   This visit type was conducted due to national recommendations for restrictions regarding the COVID-19 Pandemic (e.g. social distancing) in an effort to limit this patient's exposure and mitigate transmission in our community. Due to her co-morbid illnesses, this patient is at least at moderate risk for complications without adequate follow up. This format is felt to be most appropriate for this patient at this time. The patient did not have access to video technology or had technical difficulties with video requiring transitioning to audio format only (telephone). Physical exam was limited to content and character of the telephone converstion.     Patient location: home Provider location: [facility] Persons involved in the visit: patient, provider   Visit Date: 06/28/2019  Today's healthcare provider: Shirlee Latch, MD   Chief Complaint  Patient presents with  . Cough   Subjective    HPI  Cough and SOB  Generalized weakness x5 days Felt like this 2 weeks ago and was told that CXR looked hazy and she was discharged home SOB even at rest Saw RN at work and RR was 28 while sitting comfortably Told that BP was slightly elevated    Social History   Tobacco Use  . Smoking status: Never Smoker  . Smokeless tobacco: Never Used  Substance Use Topics  . Alcohol use: Yes    Alcohol/week: 1.0 standard drinks    Types: 1 Glasses of wine per week  . Drug use: No      Medications: Outpatient Medications Prior to Visit  Medication Sig  . albuterol (PROVENTIL HFA;VENTOLIN HFA) 108 (90 Base) MCG/ACT inhaler Inhale 2 puffs into the lungs every 4 (four) hours as needed for wheezing or shortness of breath.  Marland Kitchen azithromycin (ZITHROMAX Z-PAK) 250 MG tablet Dispense Z-Pak as directed, start on Tuesday should coughing persist  . beclomethasone (QVAR) 80 MCG/ACT inhaler Inhale 2 puffs into the lungs 2  (two) times daily.  . benzonatate (TESSALON) 100 MG capsule Take 1 capsule (100 mg total) by mouth 2 (two) times daily as needed for cough.  . calcium citrate-vitamin D (CITRACAL+D) 315-200 MG-UNIT tablet Take 1 tablet by mouth 2 (two) times daily.  . chlorpheniramine-HYDROcodone (TUSSIONEX PENNKINETIC ER) 10-8 MG/5ML SUER Take 5 mLs by mouth 2 (two) times daily.  . citalopram (CELEXA) 40 MG tablet Take 1 tablet (40 mg total) by mouth daily.  Marland Kitchen dicyclomine (BENTYL) 10 MG capsule Take 1 capsule (10 mg total) by mouth every 6 (six) hours as needed for up to 4 days for spasms.  Dorise Hiss (AIMOVIG) 140 MG/ML SOAJ Inject 140 mg into the skin every 30 (thirty) days.  Marland Kitchen gabapentin (NEURONTIN) 300 MG capsule Take 300 mg by mouth 2 (two) times daily.   Marland Kitchen HYDROcodone-acetaminophen (NORCO/VICODIN) 5-325 MG tablet Take 1 tablet by mouth every 4 (four) hours as needed for moderate pain.  . hydrOXYzine (ATARAX/VISTARIL) 10 MG tablet Take 1 tablet (10 mg total) by mouth 3 (three) times daily as needed for anxiety.  Marland Kitchen losartan (COZAAR) 100 MG tablet Take 100 mg by mouth daily.  . mycophenolate (CELLCEPT) 500 MG tablet Take 1,500 mg by mouth 2 (two) times daily.  Marland Kitchen omeprazole (PRILOSEC) 20 MG capsule Take 20 mg by mouth daily.  . ondansetron (ZOFRAN ODT) 4 MG disintegrating tablet Take 1 tablet (4 mg total) by mouth every 6 (six) hours as needed for nausea or vomiting.  . potassium chloride (K-DUR) 10 MEQ tablet  Take 10 mEq by mouth daily.  . sodium bicarbonate 325 MG tablet Take 325 mg by mouth as needed.   . tacrolimus (PROGRAF) 0.5 MG capsule Take 0.5 mg by mouth 2 (two) times daily.    No facility-administered medications prior to visit.    Review of Systems     Objective    There were no vitals taken for this visit. BP Readings from Last 3 Encounters:  06/06/19 (!) 124/104  06/03/19 (!) 139/93  06/02/19 (!) 147/99   Wt Readings from Last 3 Encounters:  06/06/19 146 lb (66.2 kg)  06/03/19  146 lb 6.4 oz (66.4 kg)  06/02/19 143 lb (64.9 kg)     Unable to speak in full sentences due to shortness of breath   Assessment & Plan     1. Shortness of breath - recurrent issue - had similar episode about 2 weeks ago -She was seen in the emergency department and chest x-ray showed bibasilar opacities concerning for pulmonary edema versus infection -She was well-appearing with normal vital signs and flu and Covid test were negative, so she was discharged -She was not given antibiotics at that time due to concern for recurrent C. difficile infections -She states it is worse today than it previously was -Concern for possible pneumonia given her immunosuppression -She is unable to speak in full sentences due to shortness of breath on the phone today -Given that and the elevated respiratory rate at work, I discussed my concern that she may need supplemental oxygen or additional testing and advised her to proceed to the emergency department  Return if symptoms worsen or fail to improve.    I discussed the assessment and treatment plan with the patient. The patient was provided an opportunity to ask questions and all were answered. The patient agreed with the plan and demonstrated an understanding of the instructions.   The patient was advised to call back or seek an in-person evaluation if the symptoms worsen or if the condition fails to improve as anticipated.  I, Lavon Paganini, MD, have reviewed all documentation for this visit. The documentation on 06/28/19 for the exam, diagnosis, procedures, and orders are all accurate and complete.   Emmanual Gauthreaux, Dionne Bucy, MD, MPH [facility]

## 2019-06-28 NOTE — ED Notes (Addendum)
See triage note  Presents with SOB and non prod cough  States she was seen about 3 weeks ago  Was given meds  Felt some better    Then sxs' returned last week   Afebrile on arrival  Stats she has a hx of asthma  No wheezing at present

## 2019-06-29 LAB — SARS CORONAVIRUS 2 (TAT 6-24 HRS): SARS Coronavirus 2: NEGATIVE

## 2019-07-01 ENCOUNTER — Encounter: Payer: Medicaid Other | Admitting: Obstetrics and Gynecology

## 2019-07-02 ENCOUNTER — Encounter: Payer: Medicaid Other | Admitting: Obstetrics and Gynecology

## 2019-07-28 ENCOUNTER — Telehealth (INDEPENDENT_AMBULATORY_CARE_PROVIDER_SITE_OTHER): Payer: Medicaid Other | Admitting: Gastroenterology

## 2019-07-28 DIAGNOSIS — A09 Infectious gastroenteritis and colitis, unspecified: Secondary | ICD-10-CM

## 2019-07-28 NOTE — Progress Notes (Signed)
Faith Camacho , MD [address]  [address]  [place], [place] [address]  Main: [phone]  Fax: [phone]   Primary Care Physician: Faith Downer, MD  Virtual Visit via Telephone Note  I connected with patient on 07/28/19 at 10:15 AM EDT by telephone and verified that I am speaking with the correct person using two identifiers.   I discussed the limitations, risks, security and privacy concerns of performing an evaluation and management service by telephone and the availability of in person appointments. I also discussed with the patient that there may be a patient responsible charge related to this service. The patient expressed understanding and agreed to proceed.  Location of Patient: Home Location of Provider: Home Persons involved: Patient and provider only   History of Present Illness: Chief Complaint  Patient presents with  . Follow-up    Diarrhea    HPI: Faith Camacho is a 29 y.o. female     Summary of history :  Summary of history :  She was initially seen and referred on 05/13/2019.  She presented to the emergency room on 05/10/2019 for diarrhea and was unable to provide a stool sample at that time.  The diarrhea had been ongoing for a week.  History of lupus, C. difficile diarrhea in the past.  She was negative for C. difficile diarrhea but GI PCR was positive for enteropathogenic E. coli.  Treated with a course of azithromycin.She presented to the emergency room yesterday i.e. 06/02/2019 with abdominal pain and diarrhea.  This was after she was treated with azithromycin for the E. coli diarrhea, initially felt better but gradually the symptoms began to return.  The emergency room contacted me  and she was unable to provide a sample of the stool for testing.  Hemoglobin was 13.8 g with a white cell count of 3.2, CMP demonstrated a potassium of 3.0 and albumin of 2.9.  Glucose of 58.  Lipase was normal.   Interval history 06/03/2019-  07/28/2019  06/04/2019: Stool for C. difficile toxin negative, GI PCR testing positive for norovirus.  Advised to commence on a brat diet and conservative treatment  Phone: 06/08/2019: Diarrhea resolved and today when I spoke to [place] that she has no further diarrhea.  Current Outpatient Medications  Medication Sig Dispense Refill  . albuterol (PROVENTIL HFA;VENTOLIN HFA) 108 (90 Base) MCG/ACT inhaler Inhale 2 puffs into the lungs every 4 (four) hours as needed for wheezing or shortness of breath. 1 Inhaler 0  . beclomethasone (QVAR) 80 MCG/ACT inhaler Inhale 2 puffs into the lungs 2 (two) times daily.    . calcium citrate-vitamin D (CITRACAL+D) 315-200 MG-UNIT tablet Take 1 tablet by mouth 2 (two) times daily.    . citalopram (CELEXA) 40 MG tablet Take 1 tablet (40 mg total) by mouth daily. 30 tablet 3  . dicyclomine (BENTYL) 10 MG capsule Take 1 capsule (10 mg total) by mouth every 6 (six) hours as needed for up to 4 days for spasms. 16 capsule 0  . Erenumab-aooe (AIMOVIG) 140 MG/ML SOAJ Inject 140 mg into the skin every 30 (thirty) days. 1 pen 11  . gabapentin (NEURONTIN) 300 MG capsule Take 300 mg by mouth 2 (two) times daily.     . hydrOXYzine (ATARAX/VISTARIL) 10 MG tablet Take 1 tablet (10 mg total) by mouth 3 (three) times daily as needed for anxiety. 30 tablet 2  . losartan (COZAAR) 100 MG tablet Take 100 mg by mouth daily.    . mycophenolate (CELLCEPT)  500 MG tablet Take 1,500 mg by mouth 2 (two) times daily.    Marland Kitchen omeprazole (PRILOSEC) 20 MG capsule Take 20 mg by mouth daily.    . ondansetron (ZOFRAN ODT) 4 MG disintegrating tablet Take 1 tablet (4 mg total) by mouth every 6 (six) hours as needed for nausea or vomiting. 20 tablet 0  . potassium chloride (K-DUR) 10 MEQ tablet Take 10 mEq by mouth daily.    . sodium bicarbonate 325 MG tablet Take 325 mg by mouth as needed.     . tacrolimus (PROGRAF) 0.5 MG capsule Take 0.5 mg by mouth 2 (two) times daily.      No current  facility-administered medications for this visit.    Allergies as of 07/28/2019 - Review Complete 07/28/2019  Allergen Reaction Noted  . Fentanyl and related Hives and Itching 04/07/2019  . Ibuprofen Other (See Comments) 09/16/2017    Review of Systems:    All systems reviewed and negative except where noted in HPI.   Observations/Objective:  Labs: CMP     Component Value Date/Time   NA 139 06/28/2019 1937   K 3.1 (L) 06/28/2019 1937   CL 109 06/28/2019 1937   CO2 26 06/28/2019 1937   GLUCOSE 87 06/28/2019 1937   BUN 11 06/28/2019 1937   CREATININE 0.62 06/28/2019 1937   CALCIUM 8.1 (L) 06/28/2019 1937   PROT 5.5 (L) 06/28/2019 1937   ALBUMIN 2.2 (L) 06/28/2019 1937   AST 14 (L) 06/28/2019 1937   ALT 10 06/28/2019 1937   ALKPHOS 57 06/28/2019 1937   BILITOT 0.6 06/28/2019 1937   GFRNONAA >60 06/28/2019 1937   GFRAA >60 06/28/2019 1937   Lab Results  Component Value Date   WBC 3.9 (L) 06/28/2019   HGB 13.2 06/28/2019   HCT 40.7 06/28/2019   MCV 95.8 06/28/2019   PLT 194 06/28/2019    Imaging Studies: DG Chest 1 View  Result Date: 06/28/2019 CLINICAL DATA:  Initial evaluation for acute cough, shortness of breath. History of asthma. EXAM: CHEST  1 VIEW COMPARISON:  Prior radiograph from 06/06/2019. FINDINGS: Cardiac and mediastinal silhouettes are within normal limits. Lungs normally inflated. Scattered interstitial opacities seen involving both lungs bilaterally, most pronounced at the lung bases, and slightly greater on the left. No frank airspace consolidation. No edema or effusion. No pneumothorax. No acute osseous finding. IMPRESSION: Scattered bilateral interstitial opacities, most pronounced at the lung bases. Findings could reflect atypical pneumonitis and/or reactive airways disease. Electronically Signed   By: Jeannine Boga M.D.   On: 06/28/2019 20:19    Assessment and Plan:   Faith Camacho is a 29 y.o. y/o female here to see me for recurrence of  diarrhea after an episode of E. coli diarrhea.  Second episode was from norovirus.  Responded well to conservative therapy.  Diarrhea is presently resolved.    I discussed the assessment and treatment plan with the patient. The patient was provided an opportunity to ask questions and all were answered. The patient agreed with the plan and demonstrated an understanding of the instructions.   The patient was advised to call back or seek an in-person evaluation if the symptoms worsen or if the condition fails to improve as anticipated.  I provided 12 minutes of non-face-to-face time during this encounter.  Dr Jonathon Bellows MD,MRCP [facility]) Gastroenterology/Hepatology Pager: [phone]   Speech recognition software was used to dictate this note.

## 2019-08-16 ENCOUNTER — Encounter: Payer: Self-pay | Admitting: Family Medicine

## 2019-08-17 ENCOUNTER — Other Ambulatory Visit: Payer: Self-pay

## 2019-08-17 ENCOUNTER — Encounter: Payer: Self-pay | Admitting: Emergency Medicine

## 2019-08-17 ENCOUNTER — Emergency Department: Payer: Medicaid Other

## 2019-08-17 ENCOUNTER — Emergency Department
Admission: EM | Admit: 2019-08-17 | Discharge: 2019-08-17 | Disposition: A | Discharge status: Home / Self Care | Payer: Medicaid Other | Attending: Emergency Medicine | Admitting: Emergency Medicine

## 2019-08-17 DIAGNOSIS — Z20822 Contact with and (suspected) exposure to covid-19: Secondary | ICD-10-CM | POA: Diagnosis not present

## 2019-08-17 DIAGNOSIS — R05 Cough: Secondary | ICD-10-CM | POA: Diagnosis not present

## 2019-08-17 DIAGNOSIS — N189 Chronic kidney disease, unspecified: Secondary | ICD-10-CM | POA: Insufficient documentation

## 2019-08-17 DIAGNOSIS — R531 Weakness: Secondary | ICD-10-CM | POA: Diagnosis not present

## 2019-08-17 DIAGNOSIS — I129 Hypertensive chronic kidney disease with stage 1 through stage 4 chronic kidney disease, or unspecified chronic kidney disease: Secondary | ICD-10-CM | POA: Diagnosis not present

## 2019-08-17 DIAGNOSIS — J45909 Unspecified asthma, uncomplicated: Secondary | ICD-10-CM | POA: Diagnosis not present

## 2019-08-17 DIAGNOSIS — R0602 Shortness of breath: Secondary | ICD-10-CM | POA: Insufficient documentation

## 2019-08-17 DIAGNOSIS — J40 Bronchitis, not specified as acute or chronic: Secondary | ICD-10-CM

## 2019-08-17 DIAGNOSIS — R059 Cough, unspecified: Secondary | ICD-10-CM

## 2019-08-17 LAB — HEPATIC FUNCTION PANEL
ALT: 8 U/L (ref 0–44)
AST: 17 U/L (ref 15–41)
Albumin: 1.4 g/dL — ABNORMAL LOW (ref 3.5–5.0)
Alkaline Phosphatase: 69 U/L (ref 38–126)
Bilirubin, Direct: 0.1 mg/dL (ref 0.0–0.2)
Indirect Bilirubin: 0.4 mg/dL (ref 0.3–0.9)
Total Bilirubin: 0.5 mg/dL (ref 0.3–1.2)
Total Protein: 5.5 g/dL — ABNORMAL LOW (ref 6.5–8.1)

## 2019-08-17 LAB — BASIC METABOLIC PANEL
Anion gap: 5 (ref 5–15)
BUN: 16 mg/dL (ref 6–20)
CO2: 28 mmol/L (ref 22–32)
Calcium: 7.5 mg/dL — ABNORMAL LOW (ref 8.9–10.3)
Chloride: 106 mmol/L (ref 98–111)
Creatinine, Ser: 0.95 mg/dL (ref 0.44–1.00)
GFR calc Af Amer: >60 mL/min (ref >60)
GFR calc non Af Amer: >60 mL/min (ref >60)
Glucose, Bld: 76 mg/dL (ref 70–99)
Potassium: 3.2 mmol/L — ABNORMAL LOW (ref 3.5–5.1)
Sodium: 139 mmol/L (ref 135–145)

## 2019-08-17 LAB — CBC
HCT: 37.7 % (ref 36.0–46.0)
Hemoglobin: 12.5 g/dL (ref 12.0–15.0)
MCH: 30 pg (ref 26.0–34.0)
MCHC: 33.2 g/dL (ref 30.0–36.0)
MCV: 90.6 fL (ref 80.0–100.0)
Platelets: 260 10*3/uL (ref 150–400)
RBC: 4.16 MIL/uL (ref 3.87–5.11)
RDW: 13.5 % (ref 11.5–15.5)
WBC: 3.7 10*3/uL — ABNORMAL LOW (ref 4.0–10.5)
nRBC: 0 % (ref 0.0–0.2)

## 2019-08-17 LAB — URINALYSIS, COMPLETE (UACMP) WITH MICROSCOPIC
Bilirubin Urine: NEGATIVE
Glucose, UA: NEGATIVE mg/dL
Hgb urine dipstick: NEGATIVE
Ketones, ur: NEGATIVE mg/dL
Nitrite: NEGATIVE
Protein, ur: >=300 mg/dL — AB
Specific Gravity, Urine: 1.016 (ref 1.005–1.030)
pH: 6 (ref 5.0–8.0)

## 2019-08-17 LAB — POCT PREGNANCY, URINE: Preg Test, Ur: NEGATIVE

## 2019-08-17 LAB — SARS CORONAVIRUS 2 BY RT PCR (HOSPITAL ORDER, PERFORMED IN ~~LOC~~ HOSPITAL LAB): SARS Coronavirus 2: NEGATIVE

## 2019-08-17 MED ORDER — HYDROCOD POLST-CPM POLST ER 10-8 MG/5ML PO SUER: 5.0000 mL | Freq: Two times a day (BID) | ORAL | 0 refills | Status: DC

## 2019-08-17 MED ORDER — IPRATROPIUM-ALBUTEROL 0.5-2.5 (3) MG/3ML IN SOLN
3.0000 mL | Freq: Once | RESPIRATORY_TRACT | Status: AC
  Administered 2019-08-17: 3 mL via RESPIRATORY_TRACT
  Filled 2019-08-17: qty 3

## 2019-08-17 MED ORDER — PREDNISONE 50 MG PO TABS: ORAL_TABLET | ORAL | 0 refills | Status: AC

## 2019-08-17 MED ORDER — HYDROCOD POLST-CPM POLST ER 10-8 MG/5ML PO SUER
5.0000 mL | Freq: Once | ORAL | Status: AC
  Administered 2019-08-17: 5 mL via ORAL
  Filled 2019-08-17: qty 5

## 2019-08-17 MED ORDER — SODIUM CHLORIDE 0.9% FLUSH: 3.0000 mL | Freq: Once | INTRAVENOUS | Status: DC

## 2019-08-17 MED ORDER — PREDNISONE 20 MG PO TABS
60.0000 mg | ORAL_TABLET | Freq: Once | ORAL | Status: AC
  Administered 2019-08-17: 60 mg via ORAL
  Filled 2019-08-17: qty 3

## 2019-08-17 MED ORDER — AZITHROMYCIN 250 MG PO TABS
ORAL_TABLET | ORAL | 0 refills | Status: DC
Start: 2019-08-17 — End: 2019-09-11

## 2019-08-17 NOTE — ED Provider Notes (Signed)
ER Provider Note       Time seen: 5:51 PM    I have reviewed the vital signs and the nursing notes.  HISTORY   Chief Complaint Cough, Weakness, and Shortness of Breath    HPI Faith Camacho is a 29 y.o. female with a history of asthma, chronic kidney disease, hypertension, interstitial lung disease, lupus who presents today for cough, weakness and shortness of breath over the last 1/2 weeks.  Patient states is not getting any worse but is not getting any better either.  She is not taking any medicine for it.  Discomfort is 8 out of 10 and is generalized.  Past Medical History:  Diagnosis Date  . Asthma   . CKD (chronic kidney disease)   . Hypertension   . ILD (interstitial lung disease) (HCC)   . Membranous glomerulonephritis   . SLE (systemic lupus erythematosus related syndrome) (HCC)     Past Surgical History:  Procedure Laterality Date  . CHOLECYSTECTOMY    . LUNG BIOPSY    . RENAL BIOPSY      Allergies Fentanyl and related and Ibuprofen   Review of Systems Constitutional: Negative for fever. Cardiovascular: Negative for chest pain. Respiratory: Positive for cough, shortness of breath Gastrointestinal: Negative for abdominal pain, vomiting and diarrhea. Musculoskeletal: Negative for back pain. Skin: Negative for rash. Neurological: Negative for headaches, positive for weakness  All systems negative/normal/unremarkable except as stated in the HPI  ____________________________________________   PHYSICAL EXAM:  VITAL SIGNS: Vitals:   08/17/19 1616  BP: 125/87  Pulse: 70  Resp: 20  Temp: 98.7 F (37.1 C)  SpO2: 95%    Constitutional: Alert and oriented. Well appearing and in no distress. Eyes: Conjunctivae are normal. Normal extraocular movements. Cardiovascular: Normal rate, regular rhythm. No murmurs, rubs, or gallops. Respiratory: Normal respiratory effort without tachypnea nor retractions.  Scattered rhonchi are noted Gastrointestinal:  Soft and nontender. Normal bowel sounds Musculoskeletal: Nontender with normal range of motion in extremities. No lower extremity tenderness nor edema. Neurologic:  Normal speech and language. No gross focal neurologic deficits are appreciated.  Skin:  Skin is warm, dry and intact. No rash noted. Psychiatric: Speech and behavior are normal.  ____________________________________________  EKG: Interpreted by me.  Sinus rhythm with rate of 81 bpm, normal PR interval, normal QRS, normal QT  ____________________________________________   LABS (pertinent positives/negatives)  Labs Reviewed  BASIC METABOLIC PANEL - Abnormal; Notable for the following components:      Result Value   Potassium 3.2 (*)    Calcium 7.5 (*)    All other components within normal limits  CBC - Abnormal; Notable for the following components:   WBC 3.7 (*)    All other components within normal limits  URINALYSIS, COMPLETE (UACMP) WITH MICROSCOPIC - Abnormal; Notable for the following components:   Color, Urine YELLOW (*)    APPearance HAZY (*)    Protein, ur >=300 (*)    Leukocytes,Ua TRACE (*)    Bacteria, UA RARE (*)    All other components within normal limits  SARS CORONAVIRUS 2 BY RT PCR (HOSPITAL ORDER, PERFORMED IN [facility])  HEPATIC FUNCTION PANEL  POC URINE PREG, ED  POCT PREGNANCY, URINE  CBG MONITORING, ED    RADIOLOGY  Images were viewed by me Chest x-ray IMPRESSION: 1. No acute chest findings. 2. Similar radiographic appearance of interstitial lung disease.  DIFFERENTIAL DIAGNOSIS  Bronchitis, asthma, COPD, COVID-19, lupus, interstitial lung disease  ASSESSMENT AND PLAN  Cough  Plan: The patient had presented for cough and shortness of breath over the last week.  She had minimal wheezing on arrival, was given duo nebs and steroids.  Patient's labs were overall reassuring although Covid testing is pending.  Patient will be placed on a burst of steroids with cough  medicine and antibiotics.  She is cleared for outpatient follow-up.  Daryel November MD    Note: This note was generated in part or whole with voice recognition software. Voice recognition is usually quite accurate but there are transcription errors that can and very often do occur. I apologize for any typographical errors that were not detected and corrected.     Emily Filbert, MD 08/17/19 Norberta Keens

## 2019-08-17 NOTE — ED Triage Notes (Signed)
Pt via pov from home with cough, weakness, sob x 1.5 weeks. Pt states it isn't getting worse, but it is not getting better either. Not taking any medicine for it. Pt alert & oriented, nad noted. Pt has hx of lupus, so does not take medications without prescription or advice of professional.

## 2019-08-20 DIAGNOSIS — E559 Vitamin D deficiency, unspecified: Secondary | ICD-10-CM | POA: Diagnosis not present

## 2019-08-20 DIAGNOSIS — Z8619 Personal history of other infectious and parasitic diseases: Secondary | ICD-10-CM | POA: Diagnosis not present

## 2019-08-20 DIAGNOSIS — E876 Hypokalemia: Secondary | ICD-10-CM | POA: Diagnosis not present

## 2019-08-20 DIAGNOSIS — Z7952 Long term (current) use of systemic steroids: Secondary | ICD-10-CM | POA: Diagnosis not present

## 2019-08-20 DIAGNOSIS — Z79899 Other long term (current) drug therapy: Secondary | ICD-10-CM | POA: Diagnosis not present

## 2019-08-20 DIAGNOSIS — D72819 Decreased white blood cell count, unspecified: Secondary | ICD-10-CM | POA: Diagnosis not present

## 2019-08-20 DIAGNOSIS — M329 Systemic lupus erythematosus, unspecified: Secondary | ICD-10-CM | POA: Diagnosis not present

## 2019-08-20 DIAGNOSIS — Z885 Allergy status to narcotic agent status: Secondary | ICD-10-CM | POA: Diagnosis not present

## 2019-08-20 DIAGNOSIS — R05 Cough: Secondary | ICD-10-CM | POA: Diagnosis not present

## 2019-08-20 DIAGNOSIS — I10 Essential (primary) hypertension: Secondary | ICD-10-CM | POA: Diagnosis not present

## 2019-08-20 DIAGNOSIS — J439 Emphysema, unspecified: Secondary | ICD-10-CM | POA: Diagnosis not present

## 2019-08-20 DIAGNOSIS — J45991 Cough variant asthma: Secondary | ICD-10-CM | POA: Diagnosis not present

## 2019-08-20 DIAGNOSIS — R808 Other proteinuria: Secondary | ICD-10-CM | POA: Diagnosis not present

## 2019-08-20 DIAGNOSIS — J849 Interstitial pulmonary disease, unspecified: Secondary | ICD-10-CM | POA: Diagnosis not present

## 2019-08-20 DIAGNOSIS — F39 Unspecified mood [affective] disorder: Secondary | ICD-10-CM | POA: Diagnosis not present

## 2019-08-20 DIAGNOSIS — Z83518 Family history of other specified eye disorder: Secondary | ICD-10-CM | POA: Diagnosis not present

## 2019-08-20 DIAGNOSIS — M3214 Glomerular disease in systemic lupus erythematosus: Secondary | ICD-10-CM | POA: Diagnosis not present

## 2019-08-20 DIAGNOSIS — Z9981 Dependence on supplemental oxygen: Secondary | ICD-10-CM | POA: Diagnosis not present

## 2019-08-20 DIAGNOSIS — I2723 Pulmonary hypertension due to lung diseases and hypoxia: Secondary | ICD-10-CM | POA: Diagnosis not present

## 2019-08-20 DIAGNOSIS — Z20822 Contact with and (suspected) exposure to covid-19: Secondary | ICD-10-CM | POA: Diagnosis not present

## 2019-08-21 DIAGNOSIS — J984 Other disorders of lung: Secondary | ICD-10-CM | POA: Diagnosis not present

## 2019-08-21 DIAGNOSIS — J8489 Other specified interstitial pulmonary diseases: Secondary | ICD-10-CM | POA: Diagnosis not present

## 2019-08-21 DIAGNOSIS — J849 Interstitial pulmonary disease, unspecified: Secondary | ICD-10-CM | POA: Diagnosis not present

## 2019-08-21 DIAGNOSIS — R918 Other nonspecific abnormal finding of lung field: Secondary | ICD-10-CM | POA: Diagnosis not present

## 2019-08-21 DIAGNOSIS — M329 Systemic lupus erythematosus, unspecified: Secondary | ICD-10-CM | POA: Diagnosis not present

## 2019-08-22 DIAGNOSIS — M329 Systemic lupus erythematosus, unspecified: Secondary | ICD-10-CM | POA: Diagnosis not present

## 2019-08-22 DIAGNOSIS — R918 Other nonspecific abnormal finding of lung field: Secondary | ICD-10-CM | POA: Diagnosis not present

## 2019-08-23 DIAGNOSIS — D849 Immunodeficiency, unspecified: Secondary | ICD-10-CM | POA: Diagnosis not present

## 2019-08-23 DIAGNOSIS — J189 Pneumonia, unspecified organism: Secondary | ICD-10-CM | POA: Diagnosis not present

## 2019-08-23 DIAGNOSIS — M329 Systemic lupus erythematosus, unspecified: Secondary | ICD-10-CM | POA: Diagnosis not present

## 2019-08-23 DIAGNOSIS — R918 Other nonspecific abnormal finding of lung field: Secondary | ICD-10-CM | POA: Diagnosis not present

## 2019-08-24 DIAGNOSIS — R809 Proteinuria, unspecified: Secondary | ICD-10-CM | POA: Diagnosis not present

## 2019-08-24 DIAGNOSIS — I2721 Secondary pulmonary arterial hypertension: Secondary | ICD-10-CM | POA: Diagnosis not present

## 2019-08-24 DIAGNOSIS — M329 Systemic lupus erythematosus, unspecified: Secondary | ICD-10-CM | POA: Diagnosis not present

## 2019-08-24 DIAGNOSIS — J849 Interstitial pulmonary disease, unspecified: Secondary | ICD-10-CM | POA: Diagnosis not present

## 2019-08-25 DIAGNOSIS — M329 Systemic lupus erythematosus, unspecified: Secondary | ICD-10-CM | POA: Diagnosis not present

## 2019-08-25 DIAGNOSIS — R809 Proteinuria, unspecified: Secondary | ICD-10-CM | POA: Diagnosis not present

## 2019-08-25 DIAGNOSIS — J849 Interstitial pulmonary disease, unspecified: Secondary | ICD-10-CM | POA: Diagnosis not present

## 2019-08-25 DIAGNOSIS — I2721 Secondary pulmonary arterial hypertension: Secondary | ICD-10-CM | POA: Diagnosis not present

## 2019-08-26 DIAGNOSIS — I2721 Secondary pulmonary arterial hypertension: Secondary | ICD-10-CM | POA: Diagnosis not present

## 2019-08-26 DIAGNOSIS — M329 Systemic lupus erythematosus, unspecified: Secondary | ICD-10-CM | POA: Diagnosis not present

## 2019-08-26 DIAGNOSIS — R809 Proteinuria, unspecified: Secondary | ICD-10-CM | POA: Diagnosis not present

## 2019-08-26 DIAGNOSIS — J849 Interstitial pulmonary disease, unspecified: Secondary | ICD-10-CM | POA: Diagnosis not present

## 2019-08-27 DIAGNOSIS — M329 Systemic lupus erythematosus, unspecified: Secondary | ICD-10-CM | POA: Diagnosis not present

## 2019-08-29 ENCOUNTER — Other Ambulatory Visit: Payer: Self-pay

## 2019-08-29 ENCOUNTER — Emergency Department: Payer: Medicaid Other

## 2019-08-29 ENCOUNTER — Encounter: Payer: Self-pay | Admitting: Emergency Medicine

## 2019-08-29 DIAGNOSIS — R079 Chest pain, unspecified: Secondary | ICD-10-CM | POA: Diagnosis not present

## 2019-08-29 DIAGNOSIS — Z5321 Procedure and treatment not carried out due to patient leaving prior to being seen by health care provider: Secondary | ICD-10-CM | POA: Diagnosis not present

## 2019-08-29 DIAGNOSIS — R0602 Shortness of breath: Secondary | ICD-10-CM | POA: Diagnosis not present

## 2019-08-29 LAB — URINALYSIS, COMPLETE (UACMP) WITH MICROSCOPIC
Bilirubin Urine: NEGATIVE
Glucose, UA: NEGATIVE mg/dL
Hgb urine dipstick: NEGATIVE
Ketones, ur: NEGATIVE mg/dL
Nitrite: NEGATIVE
Protein, ur: >=300 mg/dL — AB
Specific Gravity, Urine: 1.025 (ref 1.005–1.030)
pH: 6 (ref 5.0–8.0)

## 2019-08-29 LAB — CBC
HCT: 39.1 % (ref 36.0–46.0)
Hemoglobin: 12.8 g/dL (ref 12.0–15.0)
MCH: 30.1 pg (ref 26.0–34.0)
MCHC: 32.7 g/dL (ref 30.0–36.0)
MCV: 92 fL (ref 80.0–100.0)
Platelets: 320 10*3/uL (ref 150–400)
RBC: 4.25 MIL/uL (ref 3.87–5.11)
RDW: 14.4 % (ref 11.5–15.5)
WBC: 8.3 10*3/uL (ref 4.0–10.5)
nRBC: 0 % (ref 0.0–0.2)

## 2019-08-29 LAB — BASIC METABOLIC PANEL
Anion gap: 5 (ref 5–15)
BUN: 22 mg/dL — ABNORMAL HIGH (ref 6–20)
CO2: 24 mmol/L (ref 22–32)
Calcium: 7.8 mg/dL — ABNORMAL LOW (ref 8.9–10.3)
Chloride: 105 mmol/L (ref 98–111)
Creatinine, Ser: 1.07 mg/dL — ABNORMAL HIGH (ref 0.44–1.00)
GFR calc Af Amer: >60 mL/min (ref >60)
GFR calc non Af Amer: >60 mL/min (ref >60)
Glucose, Bld: 121 mg/dL — ABNORMAL HIGH (ref 70–99)
Potassium: 3.6 mmol/L (ref 3.5–5.1)
Sodium: 134 mmol/L — ABNORMAL LOW (ref 135–145)

## 2019-08-29 LAB — POCT PREGNANCY, URINE: Preg Test, Ur: NEGATIVE

## 2019-08-29 NOTE — ED Notes (Signed)
Rainbow and 1 extra light green tube sent to lab

## 2019-08-29 NOTE — ED Triage Notes (Signed)
Pt arrived via POV from home reports she was recently admitted at [facility] for Lupus flare-up, states she was just discharged on Friday. States she continues to have pain despite taking oxycodone and tylenol. Pt states she has been having shortness of breath that has been continued over the past several weeks. Pt states she had a cough but states the cough is now gone.

## 2019-08-30 ENCOUNTER — Emergency Department
Admission: EM | Admit: 2019-08-30 | Discharge: 2019-08-30 | Disposition: A | Discharge status: Home / Self Care | Payer: Medicaid Other | Attending: Emergency Medicine | Admitting: Emergency Medicine

## 2019-08-30 ENCOUNTER — Telehealth: Payer: Self-pay | Admitting: Emergency Medicine

## 2019-08-30 NOTE — Telephone Encounter (Signed)
Called patient due to lwot to inquire about condition and follow up plans.  Says she was sent here by her doctor.  She is no better.  I told her to call her doctor back and let them know how she is doing and that labs are resulted.  I told her to return here any time.

## 2019-09-01 DIAGNOSIS — G894 Chronic pain syndrome: Secondary | ICD-10-CM | POA: Diagnosis not present

## 2019-09-01 DIAGNOSIS — M329 Systemic lupus erythematosus, unspecified: Secondary | ICD-10-CM | POA: Diagnosis not present

## 2019-09-01 DIAGNOSIS — J849 Interstitial pulmonary disease, unspecified: Secondary | ICD-10-CM | POA: Diagnosis not present

## 2019-09-01 DIAGNOSIS — N052 Unspecified nephritic syndrome with diffuse membranous glomerulonephritis: Secondary | ICD-10-CM | POA: Diagnosis not present

## 2019-09-01 NOTE — Progress Notes (Deleted)
     Established patient visit   Patient: Faith Camacho   DOB: 1991-01-18   29 y.o. Female  MRN: [id number] Visit Date: 09/02/2019  Today's healthcare provider: Trey Sailors, PA-C   No chief complaint on file.  Subjective    HPI  Follow up Hospitalization  Patient was admitted to [facility] on 08/29/2019 and discharged on 08/30/2019. She was treated for Lupus flare up. Treatment for this included: EKG, chest x-ray and labs. Telephone follow up was done on none She reports {excellent/good/fair:19992} compliance with treatment. She reports this condition is {resolved/improved/worsened:23923}.  ----------------------------------------------------------------------------------------- -   {Show patient history (optional):23778::" "}   Medications: Outpatient Medications Prior to Visit  Medication Sig  . albuterol (PROVENTIL HFA;VENTOLIN HFA) 108 (90 Base) MCG/ACT inhaler Inhale 2 puffs into the lungs every 4 (four) hours as needed for wheezing or shortness of breath.  Marland Kitchen azithromycin (ZITHROMAX Z-PAK) 250 MG tablet Dispense 1 pack as directed  . beclomethasone (QVAR) 80 MCG/ACT inhaler Inhale 2 puffs into the lungs 2 (two) times daily.  . calcium citrate-vitamin D (CITRACAL+D) 315-200 MG-UNIT tablet Take 1 tablet by mouth 2 (two) times daily.  . chlorpheniramine-HYDROcodone (TUSSIONEX PENNKINETIC ER) 10-8 MG/5ML SUER Take 5 mLs by mouth 2 (two) times daily.  . citalopram (CELEXA) 40 MG tablet Take 1 tablet (40 mg total) by mouth daily.  Marland Kitchen dicyclomine (BENTYL) 10 MG capsule Take 1 capsule (10 mg total) by mouth every 6 (six) hours as needed for up to 4 days for spasms.  Dorise Hiss (AIMOVIG) 140 MG/ML SOAJ Inject 140 mg into the skin every 30 (thirty) days.  Marland Kitchen gabapentin (NEURONTIN) 300 MG capsule Take 300 mg by mouth 2 (two) times daily.   . hydrOXYzine (ATARAX/VISTARIL) 10 MG tablet Take 1 tablet (10 mg total) by mouth 3 (three) times daily as needed for anxiety.  Marland Kitchen losartan  (COZAAR) 100 MG tablet Take 100 mg by mouth daily.  . mycophenolate (CELLCEPT) 500 MG tablet Take 1,500 mg by mouth 2 (two) times daily.  Marland Kitchen omeprazole (PRILOSEC) 20 MG capsule Take 20 mg by mouth daily.  . ondansetron (ZOFRAN ODT) 4 MG disintegrating tablet Take 1 tablet (4 mg total) by mouth every 6 (six) hours as needed for nausea or vomiting.  . potassium chloride (K-DUR) 10 MEQ tablet Take 10 mEq by mouth daily.  . predniSONE (DELTASONE) 50 MG tablet Take 1 tablet by mouth daily  . sodium bicarbonate 325 MG tablet Take 325 mg by mouth as needed.   . tacrolimus (PROGRAF) 0.5 MG capsule Take 0.5 mg by mouth 2 (two) times daily.    No facility-administered medications prior to visit.    Review of Systems  Constitutional: Negative.   Respiratory: Negative.   Cardiovascular: Negative.   Musculoskeletal: Negative.   Hematological: Negative.     {Heme  Chem  Endocrine  Serology  Results Review (optional):23779::" "}  Objective    LMP 08/16/2019 (Within Days)  {Show previous vital signs (optional):23777::" "}  Physical Exam  ***  No results found for any visits on 09/02/19.  Assessment & Plan     ***  No follow-ups on file.      {provider attestation***:1}   Maryella Shivers  [facility] [phone] (phone) [phone] (fax)  [facility]

## 2019-09-02 ENCOUNTER — Inpatient Hospital Stay: Payer: Medicaid Other | Admitting: Physician Assistant

## 2019-09-11 ENCOUNTER — Ambulatory Visit
Admission: EM | Admit: 2019-09-11 | Discharge: 2019-09-11 | Disposition: A | Discharge status: Home / Self Care | Payer: Medicaid Other | Attending: Family Medicine | Admitting: Family Medicine

## 2019-09-11 ENCOUNTER — Other Ambulatory Visit: Payer: Self-pay

## 2019-09-11 ENCOUNTER — Encounter: Payer: Self-pay | Admitting: Emergency Medicine

## 2019-09-11 DIAGNOSIS — B029 Zoster without complications: Secondary | ICD-10-CM | POA: Diagnosis not present

## 2019-09-11 MED ORDER — VALACYCLOVIR HCL 1 G PO TABS
1000.0000 mg | ORAL_TABLET | Freq: Three times a day (TID) | ORAL | 0 refills | Status: AC
Start: 2019-09-11 — End: 2019-09-21

## 2019-09-11 NOTE — ED Triage Notes (Signed)
Patient c/o itchy rash on her back that started on Wed.
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
## 2019-09-28 ENCOUNTER — Encounter: Payer: Medicaid Other | Admitting: Obstetrics and Gynecology

## 2019-10-10 ENCOUNTER — Ambulatory Visit
Admission: EM | Admit: 2019-10-10 | Discharge: 2019-10-10 | Disposition: A | Discharge status: Home / Self Care | Payer: Medicaid Other | Attending: Family Medicine | Admitting: Family Medicine

## 2019-10-10 ENCOUNTER — Other Ambulatory Visit: Payer: Self-pay

## 2019-10-10 DIAGNOSIS — G43719 Chronic migraine without aura, intractable, without status migrainosus: Secondary | ICD-10-CM

## 2019-10-10 MED ORDER — DEXAMETHASONE SODIUM PHOSPHATE 10 MG/ML IJ SOLN
10.0000 mg | Freq: Once | INTRAMUSCULAR | Status: AC
  Administered 2019-10-10: 10 mg via INTRAMUSCULAR

## 2019-10-10 MED ORDER — PROMETHAZINE HCL 25 MG/ML IJ SOLN
25.0000 mg | Freq: Once | INTRAMUSCULAR | Status: AC
Start: 2019-10-10 — End: 2019-10-10
  Administered 2019-10-10: 25 mg via INTRAMUSCULAR

## 2019-10-10 MED ORDER — BUTALBITAL-APAP-CAFFEINE 50-325-40 MG PO TABS
1.0000 | ORAL_TABLET | Freq: Four times a day (QID) | ORAL | 0 refills | Status: DC | PRN
Start: 2019-10-10 — End: 2020-02-13

## 2019-10-10 NOTE — ED Triage Notes (Signed)
Patient states that she has been having a headache since Tuesday. States that she has taken her rizatriptan without relief.
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
## 2019-11-03 DIAGNOSIS — G894 Chronic pain syndrome: Secondary | ICD-10-CM | POA: Diagnosis not present

## 2019-11-03 DIAGNOSIS — M329 Systemic lupus erythematosus, unspecified: Secondary | ICD-10-CM | POA: Diagnosis not present

## 2019-11-03 DIAGNOSIS — J849 Interstitial pulmonary disease, unspecified: Secondary | ICD-10-CM | POA: Diagnosis not present

## 2019-11-03 DIAGNOSIS — Z79899 Other long term (current) drug therapy: Secondary | ICD-10-CM | POA: Diagnosis not present

## 2019-11-08 ENCOUNTER — Telehealth: Payer: Self-pay

## 2019-11-08 ENCOUNTER — Other Ambulatory Visit: Payer: Self-pay | Admitting: Family Medicine

## 2019-11-08 ENCOUNTER — Encounter: Payer: Self-pay | Admitting: Family Medicine

## 2019-11-08 MED ORDER — CITALOPRAM HYDROBROMIDE 40 MG PO TABS: 40.0000 mg | ORAL_TABLET | Freq: Every day | ORAL | 0 refills | Status: DC

## 2019-11-08 NOTE — Telephone Encounter (Signed)
Requested Prescriptions  Pending Prescriptions Disp Refills  . citalopram (CELEXA) 40 MG tablet [Pharmacy Med Name: Citalopram Hydrobromide 40 MG Oral Tablet] 30 tablet 0    Sig: Take 1 tablet by mouth once daily     Psychiatry:  Antidepressants - SSRI Passed - 11/08/2019 12:23 PM      Passed - Valid encounter within last 6 months    Recent Outpatient Visits          4 months ago Shortness of breath   [facility] [place], Marzella Schlein, [place]   10 months ago Encounter for annual physical exam   [facility] [place], Marzella Schlein, MD   10 months ago C. difficile colitis   [facility] [place], Marzella Schlein, MD   1 year ago Dizziness   [facility] [place], Lavella Hammock, [place]   1 year ago Pain, dental   [facility] Bacigalupo, Marzella Schlein, MD      Future Appointments            Tomorrow Bacigalupo, Marzella Schlein, MD [facility]   In 1 month Hildred Laser, MD [facility]

## 2019-11-08 NOTE — Telephone Encounter (Signed)
Spoke with patient and let her know that both prescriptions had been discontinued so I was unable to fill. I let her know that Dr. Valentino Saxon is out of the office until Friday. I explained that she can take ibuprofen OTC to equal the 600 or 800 mg. This should be just as affective. I will send message to Dr. Valentino Saxon for when she returns. Patient stated that she is on her period now.

## 2019-11-08 NOTE — Telephone Encounter (Signed)
Pt called in and stated that she would like a refill on her IBUPROFEN 600. The pt stated that she is having bad cramps. The pt still uses [facility] on [address]. Please advise

## 2019-11-08 NOTE — Telephone Encounter (Signed)
Patient has not been seen since 2019.  Needs appointment.

## 2019-11-09 ENCOUNTER — Encounter: Payer: Self-pay | Admitting: Family Medicine

## 2019-11-09 ENCOUNTER — Telehealth (INDEPENDENT_AMBULATORY_CARE_PROVIDER_SITE_OTHER): Payer: Medicaid Other | Admitting: Family Medicine

## 2019-11-09 DIAGNOSIS — F4310 Post-traumatic stress disorder, unspecified: Secondary | ICD-10-CM | POA: Diagnosis not present

## 2019-11-09 DIAGNOSIS — F4323 Adjustment disorder with mixed anxiety and depressed mood: Secondary | ICD-10-CM | POA: Diagnosis not present

## 2019-11-09 MED ORDER — CITALOPRAM HYDROBROMIDE 40 MG PO TABS: 40.0000 mg | ORAL_TABLET | Freq: Every day | ORAL | 1 refills | Status: AC

## 2019-11-09 MED ORDER — BUSPIRONE HCL 5 MG PO TABS: 5.0000 mg | ORAL_TABLET | Freq: Three times a day (TID) | ORAL | 2 refills | Status: AC
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
## 2019-11-09 NOTE — Telephone Encounter (Signed)
LM for patient that she would need to call the office and schedule appointment. Patient has not been seen since 2019 per Dr. Valentino Saxon.

## 2019-11-09 NOTE — Assessment & Plan Note (Signed)
Chronic and uncontrolled Was previously doing well with therapy, but now her therapist is no longer in network New referral placed for psychologist Continue Celexa 40 mg daily If she is having more anxiety, I will also add BuSpar 5 mg 3 times daily Can continue to use hydroxyzine as needed Follow-up in 2 months and repeat PHQ-9 and GAD-7

## 2019-11-28 DIAGNOSIS — R3989 Other symptoms and signs involving the genitourinary system: Secondary | ICD-10-CM | POA: Diagnosis not present

## 2019-11-28 DIAGNOSIS — N3001 Acute cystitis with hematuria: Secondary | ICD-10-CM | POA: Diagnosis not present

## 2019-12-02 ENCOUNTER — Encounter: Payer: Self-pay | Admitting: Emergency Medicine

## 2019-12-02 ENCOUNTER — Other Ambulatory Visit: Payer: Self-pay

## 2019-12-02 ENCOUNTER — Emergency Department: Payer: Medicaid Other

## 2019-12-02 ENCOUNTER — Emergency Department
Admission: EM | Admit: 2019-12-02 | Discharge: 2019-12-02 | Disposition: A | Discharge status: Home / Self Care | Payer: Medicaid Other | Attending: Emergency Medicine | Admitting: Emergency Medicine

## 2019-12-02 DIAGNOSIS — R300 Dysuria: Secondary | ICD-10-CM | POA: Diagnosis not present

## 2019-12-02 DIAGNOSIS — Z79899 Other long term (current) drug therapy: Secondary | ICD-10-CM | POA: Insufficient documentation

## 2019-12-02 DIAGNOSIS — N12 Tubulo-interstitial nephritis, not specified as acute or chronic: Secondary | ICD-10-CM

## 2019-12-02 DIAGNOSIS — D849 Immunodeficiency, unspecified: Secondary | ICD-10-CM

## 2019-12-02 DIAGNOSIS — N10 Acute tubulo-interstitial nephritis: Secondary | ICD-10-CM | POA: Diagnosis not present

## 2019-12-02 DIAGNOSIS — J45909 Unspecified asthma, uncomplicated: Secondary | ICD-10-CM | POA: Diagnosis not present

## 2019-12-02 DIAGNOSIS — M5136 Other intervertebral disc degeneration, lumbar region: Secondary | ICD-10-CM | POA: Diagnosis not present

## 2019-12-02 DIAGNOSIS — Z7951 Long term (current) use of inhaled steroids: Secondary | ICD-10-CM | POA: Insufficient documentation

## 2019-12-02 DIAGNOSIS — N390 Urinary tract infection, site not specified: Secondary | ICD-10-CM | POA: Diagnosis not present

## 2019-12-02 DIAGNOSIS — M5137 Other intervertebral disc degeneration, lumbosacral region: Secondary | ICD-10-CM | POA: Diagnosis not present

## 2019-12-02 DIAGNOSIS — N182 Chronic kidney disease, stage 2 (mild): Secondary | ICD-10-CM | POA: Diagnosis not present

## 2019-12-02 DIAGNOSIS — R109 Unspecified abdominal pain: Secondary | ICD-10-CM | POA: Diagnosis not present

## 2019-12-02 DIAGNOSIS — I129 Hypertensive chronic kidney disease with stage 1 through stage 4 chronic kidney disease, or unspecified chronic kidney disease: Secondary | ICD-10-CM | POA: Diagnosis not present

## 2019-12-02 DIAGNOSIS — R3915 Urgency of urination: Secondary | ICD-10-CM | POA: Diagnosis not present

## 2019-12-02 DIAGNOSIS — Z9049 Acquired absence of other specified parts of digestive tract: Secondary | ICD-10-CM | POA: Diagnosis not present

## 2019-12-02 LAB — CBC
HCT: 40.8 % (ref 36.0–46.0)
Hemoglobin: 13.6 g/dL (ref 12.0–15.0)
MCH: 30.6 pg (ref 26.0–34.0)
MCHC: 33.3 g/dL (ref 30.0–36.0)
MCV: 91.9 fL (ref 80.0–100.0)
Platelets: 268 10*3/uL (ref 150–400)
RBC: 4.44 MIL/uL (ref 3.87–5.11)
RDW: 13 % (ref 11.5–15.5)
WBC: 4.6 10*3/uL (ref 4.0–10.5)
nRBC: 0 % (ref 0.0–0.2)

## 2019-12-02 LAB — URINALYSIS, COMPLETE (UACMP) WITH MICROSCOPIC
Bacteria, UA: NONE SEEN
Bilirubin Urine: NEGATIVE
Glucose, UA: NEGATIVE mg/dL
Ketones, ur: NEGATIVE mg/dL
Nitrite: NEGATIVE
Protein, ur: >=300 mg/dL — AB
Specific Gravity, Urine: 1.025 (ref 1.005–1.030)
WBC, UA: >50 WBC/hpf — ABNORMAL HIGH (ref 0–5)
pH: 5 (ref 5.0–8.0)

## 2019-12-02 LAB — BASIC METABOLIC PANEL
Anion gap: 9 (ref 5–15)
BUN: 14 mg/dL (ref 6–20)
CO2: 21 mmol/L — ABNORMAL LOW (ref 22–32)
Calcium: 8.1 mg/dL — ABNORMAL LOW (ref 8.9–10.3)
Chloride: 107 mmol/L (ref 98–111)
Creatinine, Ser: 0.76 mg/dL (ref 0.44–1.00)
GFR, Estimated: >60 mL/min (ref >60)
Glucose, Bld: 89 mg/dL (ref 70–99)
Potassium: 3.6 mmol/L (ref 3.5–5.1)
Sodium: 137 mmol/L (ref 135–145)

## 2019-12-02 LAB — POC URINE PREG, ED: Preg Test, Ur: NEGATIVE

## 2019-12-02 MED ORDER — LACTATED RINGERS IV BOLUS
1000.0000 mL | Freq: Once | INTRAVENOUS | Status: AC
  Administered 2019-12-02: 1000 mL via INTRAVENOUS

## 2019-12-02 MED ORDER — SULFAMETHOXAZOLE-TRIMETHOPRIM 800-160 MG PO TABS: 1.0000 | ORAL_TABLET | Freq: Two times a day (BID) | ORAL | 0 refills | Status: AC

## 2019-12-02 MED ORDER — IOHEXOL 300 MG/ML  SOLN
100.0000 mL | Freq: Once | INTRAMUSCULAR | Status: AC | PRN
  Administered 2019-12-02: 100 mL via INTRAVENOUS

## 2019-12-02 MED ORDER — ACETAMINOPHEN 500 MG PO TABS
1000.0000 mg | ORAL_TABLET | Freq: Once | ORAL | Status: AC
  Administered 2019-12-02: 1000 mg via ORAL
  Filled 2019-12-02: qty 2

## 2019-12-02 MED ORDER — OXYCODONE HCL 5 MG PO TABS
5.0000 mg | ORAL_TABLET | Freq: Once | ORAL | Status: AC
  Administered 2019-12-02: 5 mg via ORAL
  Filled 2019-12-02: qty 1

## 2019-12-02 MED ORDER — SODIUM CHLORIDE 0.9 % IV SOLN
1.0000 g | Freq: Once | INTRAVENOUS | Status: AC
  Administered 2019-12-02: 1 g via INTRAVENOUS
  Filled 2019-12-02: qty 10

## 2019-12-02 NOTE — ED Triage Notes (Signed)
Pt comes into the ED via POv c/o bilateral low back pain and progressing UTI.  Pt states she was diagnosed on Sunday with the UTI and started abx, but was told to come here if she starts getting low back and flank pain.  Pt currently has pain on both sides.  Denies any fevers at home, N/V.
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
## 2019-12-03 ENCOUNTER — Telehealth: Payer: Self-pay | Admitting: *Deleted

## 2019-12-03 ENCOUNTER — Encounter: Payer: Self-pay | Admitting: Family Medicine

## 2019-12-03 DIAGNOSIS — N390 Urinary tract infection, site not specified: Secondary | ICD-10-CM | POA: Diagnosis not present

## 2019-12-03 DIAGNOSIS — R3915 Urgency of urination: Secondary | ICD-10-CM | POA: Diagnosis not present

## 2019-12-03 DIAGNOSIS — R300 Dysuria: Secondary | ICD-10-CM | POA: Diagnosis not present

## 2019-12-03 DIAGNOSIS — R109 Unspecified abdominal pain: Secondary | ICD-10-CM | POA: Diagnosis not present

## 2019-12-03 NOTE — Telephone Encounter (Signed)
Transition Care Management Unsuccessful Follow-up Telephone Call  Date of discharge and from where:  12/02/19 [facility] ED  Attempts:  1st Attempt  Reason for unsuccessful TCM follow-up call:  Left voice message

## 2019-12-04 LAB — URINE CULTURE

## 2019-12-06 ENCOUNTER — Encounter: Payer: Self-pay | Admitting: Family Medicine

## 2019-12-06 NOTE — Telephone Encounter (Signed)
See other mychart message.

## 2019-12-06 NOTE — Telephone Encounter (Signed)
Message will be closed as we are outside of the 72 hour window to contact patient.  °

## 2019-12-20 NOTE — Progress Notes (Deleted)
Pt present for annual exam. Pt up to date on pap last pap 07/09/17 results negative.

## 2019-12-21 ENCOUNTER — Encounter: Payer: Medicaid Other | Admitting: Obstetrics and Gynecology

## 2019-12-21 DIAGNOSIS — Z01419 Encounter for gynecological examination (general) (routine) without abnormal findings: Secondary | ICD-10-CM

## 2019-12-22 DIAGNOSIS — Z79899 Other long term (current) drug therapy: Secondary | ICD-10-CM | POA: Diagnosis not present

## 2019-12-22 DIAGNOSIS — M329 Systemic lupus erythematosus, unspecified: Secondary | ICD-10-CM | POA: Diagnosis not present

## 2019-12-22 DIAGNOSIS — M3214 Glomerular disease in systemic lupus erythematosus: Secondary | ICD-10-CM | POA: Diagnosis not present

## 2019-12-22 DIAGNOSIS — J849 Interstitial pulmonary disease, unspecified: Secondary | ICD-10-CM | POA: Diagnosis not present

## 2019-12-27 ENCOUNTER — Emergency Department
Admission: EM | Admit: 2019-12-27 | Discharge: 2019-12-27 | Disposition: A | Discharge status: Home / Self Care | Payer: Medicaid Other | Attending: Emergency Medicine | Admitting: Emergency Medicine

## 2019-12-27 ENCOUNTER — Other Ambulatory Visit: Payer: Self-pay

## 2019-12-27 ENCOUNTER — Encounter: Payer: Self-pay | Admitting: Intensive Care

## 2019-12-27 DIAGNOSIS — Z79899 Other long term (current) drug therapy: Secondary | ICD-10-CM | POA: Diagnosis not present

## 2019-12-27 DIAGNOSIS — R809 Proteinuria, unspecified: Secondary | ICD-10-CM | POA: Diagnosis not present

## 2019-12-27 DIAGNOSIS — G8929 Other chronic pain: Secondary | ICD-10-CM

## 2019-12-27 DIAGNOSIS — I129 Hypertensive chronic kidney disease with stage 1 through stage 4 chronic kidney disease, or unspecified chronic kidney disease: Secondary | ICD-10-CM | POA: Diagnosis not present

## 2019-12-27 DIAGNOSIS — M545 Low back pain, unspecified: Secondary | ICD-10-CM | POA: Diagnosis not present

## 2019-12-27 DIAGNOSIS — N182 Chronic kidney disease, stage 2 (mild): Secondary | ICD-10-CM | POA: Insufficient documentation

## 2019-12-27 DIAGNOSIS — R109 Unspecified abdominal pain: Secondary | ICD-10-CM | POA: Diagnosis present

## 2019-12-27 LAB — CBC WITH DIFFERENTIAL/PLATELET
Abs Immature Granulocytes: 0.02 10*3/uL (ref 0.00–0.07)
Basophils Absolute: 0 10*3/uL (ref 0.0–0.1)
Basophils Relative: 0 %
Eosinophils Absolute: 0 10*3/uL (ref 0.0–0.5)
Eosinophils Relative: 1 %
HCT: 43.8 % (ref 36.0–46.0)
Hemoglobin: 14.4 g/dL (ref 12.0–15.0)
Immature Granulocytes: 0 %
Lymphocytes Relative: 27 %
Lymphs Abs: 1.4 10*3/uL (ref 0.7–4.0)
MCH: 30.3 pg (ref 26.0–34.0)
MCHC: 32.9 g/dL (ref 30.0–36.0)
MCV: 92 fL (ref 80.0–100.0)
Monocytes Absolute: 0.3 10*3/uL (ref 0.1–1.0)
Monocytes Relative: 6 %
Neutro Abs: 3.3 10*3/uL (ref 1.7–7.7)
Neutrophils Relative %: 66 %
Platelets: 262 10*3/uL (ref 150–400)
RBC: 4.76 MIL/uL (ref 3.87–5.11)
RDW: 12.3 % (ref 11.5–15.5)
WBC: 5 10*3/uL (ref 4.0–10.5)
nRBC: 0 % (ref 0.0–0.2)

## 2019-12-27 LAB — URINALYSIS, COMPLETE (UACMP) WITH MICROSCOPIC
Bilirubin Urine: NEGATIVE
Glucose, UA: NEGATIVE mg/dL
Ketones, ur: NEGATIVE mg/dL
Nitrite: NEGATIVE
Protein, ur: >=300 mg/dL — AB
Specific Gravity, Urine: 1.018 (ref 1.005–1.030)
pH: 5 (ref 5.0–8.0)

## 2019-12-27 LAB — COMPREHENSIVE METABOLIC PANEL
ALT: 9 U/L (ref 0–44)
AST: 15 U/L (ref 15–41)
Albumin: 2.6 g/dL — ABNORMAL LOW (ref 3.5–5.0)
Alkaline Phosphatase: 48 U/L (ref 38–126)
Anion gap: 8 (ref 5–15)
BUN: 11 mg/dL (ref 6–20)
CO2: 22 mmol/L (ref 22–32)
Calcium: 7.8 mg/dL — ABNORMAL LOW (ref 8.9–10.3)
Chloride: 106 mmol/L (ref 98–111)
Creatinine, Ser: 0.67 mg/dL (ref 0.44–1.00)
GFR, Estimated: >60 mL/min (ref >60)
Glucose, Bld: 93 mg/dL (ref 70–99)
Potassium: 4 mmol/L (ref 3.5–5.1)
Sodium: 136 mmol/L (ref 135–145)
Total Bilirubin: 1 mg/dL (ref 0.3–1.2)
Total Protein: 5.8 g/dL — ABNORMAL LOW (ref 6.5–8.1)

## 2019-12-27 LAB — POC URINE PREG, ED: Preg Test, Ur: NEGATIVE

## 2019-12-27 MED ORDER — KETOROLAC TROMETHAMINE 30 MG/ML IJ SOLN
15.0000 mg | Freq: Once | INTRAMUSCULAR | Status: AC
  Administered 2019-12-27: 15 mg via INTRAVENOUS
  Filled 2019-12-27: qty 1

## 2019-12-27 MED ORDER — DEXAMETHASONE SODIUM PHOSPHATE 10 MG/ML IJ SOLN
10.0000 mg | Freq: Once | INTRAMUSCULAR | Status: AC
  Administered 2019-12-27: 10 mg via INTRAVENOUS
  Filled 2019-12-27: qty 1

## 2019-12-27 NOTE — Discharge Instructions (Signed)
Please make sure you are taking all of your medications as prescribed.  You also need to discuss diet restrictions with your specialist. I recommend lowering the amount of protein and salt in your diet.

## 2019-12-27 NOTE — ED Notes (Signed)
Patient here due to back pain and weakness that seems to have gotten worse over the past week. Pain 9/10 lower back
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
## 2019-12-27 NOTE — ED Triage Notes (Signed)
Patient c/o lower back pain since last Wednesday. Denies trouble urinating. Denies injury

## 2019-12-28 ENCOUNTER — Telehealth: Payer: Self-pay

## 2019-12-28 ENCOUNTER — Ambulatory Visit: Payer: Medicaid Other | Admitting: Adult Health

## 2019-12-28 LAB — URINE CULTURE

## 2019-12-28 NOTE — Telephone Encounter (Signed)
Copied from CRM [id number]. Topic: General - Inquiry >> Dec 28, 2019 10:00 AM Daphine Deutscher D wrote: Reason for CRM: Pt called saying she wen to [facility] ED last night for back pain stomach pain.  Also says she is having fatigue.  They did not do anything she said except lab work and IV pain then sent her home.  She is still having lower back pain.   She would like Dr. Senaida Lange nurse to call her back.  CB#  [phone]

## 2019-12-28 NOTE — Telephone Encounter (Signed)
Please triage.  Reviewed hospital note as below,she was given Toradol and Decadron in ER, advised to call her nephrologist for further management as she had a lot of protein in her urine. She should be advised to call her nephrologist today 12/28/2019.   If emergent severe symptoms seek care immediately.       Discharge summary:  29 year old female presenting to the emergency department for back pain since last Wednesday.  She has a history of lupus nephritis.  While awaiting ER room assignment, protocol labs and urinalysis were collected.  It does show that she has a large amount of protein in her urine.  This is consistent with her nephritis.  Patient states that she is currently taking 5 mg of prednisone as well as all the other medications that she has been prescribed but does not feel that she is improving.   Patient states that no one has discussed with her the need to decrease her intake of salt and/or protein.  She is sitting on the bed eating fast food.  We discussed that diet changes aren't going to be necessary and she needs to discuss this in full with her rheumatologist/nephrologist.  For now, we will treat her with Decadron and Toradol.   Patient reports significant improvement of her pain and would like to be discharged home.  She is encouraged to schedule a follow-up appointment with nephrology or rheumatology whoever is prescribing her meds.

## 2019-12-28 NOTE — Telephone Encounter (Signed)
Transition Care Management Follow-up Telephone Call  Date of discharge and from where: 12/27/2019 [facility]  How have you been since you were released from the hospital? Feels the same feeling worse, reached out to Rheumatology awaiting a call back.   Any questions or concerns? No  Items Reviewed:  Did the pt receive and understand the discharge instructions provided? Yes   Medications obtained and verified? No   Other? No   Any new allergies since your discharge? No   Dietary orders reviewed? Yes  Do you have support at home? Yes   Home Care and Equipment/Supplies: Were home health services ordered? not applicable If so, what is the name of the agency? na  Has the agency set up a time to come to the patient's home? not applicable Were any new equipment or medical supplies ordered?  No What is the name of the medical supply agency? na Were you able to get the supplies/equipment? not applicable Do you have any questions related to the use of the equipment or supplies? No  Functional Questionnaire: (I = Independent and D = Dependent) ADLs: I  Bathing/Dressing- I  Meal Prep- I  Eating- I  Maintaining continence- I  Transferring/Ambulation- I  Managing Meds- I  Follow up appointments reviewed:   PCP Hospital f/u appt confirmed? No    Specialist Hospital f/u appt confirmed? Yes  Scheduled to see [facility] on 03/15/2020 @ 9:45am.  Are transportation arrangements needed? No   If their condition worsens, is the pt aware to call PCP or go to the Emergency Dept.? Yes  Was the patient provided with contact information for the PCP's office or ED? Yes  Was to pt encouraged to call back with questions or concerns? Yes  Patient denied me contacting PCP for an appointment as her issues may be more for Rheumatology. Patient stated she called to leave a message to see if she could be seen before February. Advised patient if they are unable to  see her sooner definitely contact PCP office maybe they can offer some type of relief. Patient verbally agreed and was appreciative of the call.

## 2019-12-29 ENCOUNTER — Encounter: Payer: Self-pay | Admitting: Adult Health

## 2019-12-29 ENCOUNTER — Other Ambulatory Visit: Payer: Self-pay

## 2019-12-29 ENCOUNTER — Other Ambulatory Visit
Admission: RE | Admit: 2019-12-29 | Discharge: 2019-12-29 | Disposition: A | Discharge status: Home / Self Care | Payer: Medicaid Other | Source: Ambulatory Visit | Attending: Internal Medicine | Admitting: Internal Medicine

## 2019-12-29 ENCOUNTER — Telehealth: Payer: Self-pay

## 2019-12-29 ENCOUNTER — Ambulatory Visit (INDEPENDENT_AMBULATORY_CARE_PROVIDER_SITE_OTHER): Payer: Medicaid Other | Admitting: Adult Health

## 2019-12-29 ENCOUNTER — Other Ambulatory Visit
Admission: RE | Admit: 2019-12-29 | Discharge: 2019-12-29 | Disposition: A | Discharge status: Home / Self Care | Payer: Medicaid Other | Source: Ambulatory Visit | Attending: Adult Health | Admitting: Adult Health

## 2019-12-29 VITALS — BP 110/90 | HR 86 | Temp 98.3°F | Wt 150.0 lb

## 2019-12-29 DIAGNOSIS — N182 Chronic kidney disease, stage 2 (mild): Secondary | ICD-10-CM

## 2019-12-29 DIAGNOSIS — J849 Interstitial pulmonary disease, unspecified: Secondary | ICD-10-CM | POA: Insufficient documentation

## 2019-12-29 DIAGNOSIS — M545 Low back pain, unspecified: Secondary | ICD-10-CM | POA: Diagnosis not present

## 2019-12-29 DIAGNOSIS — R82998 Other abnormal findings in urine: Secondary | ICD-10-CM | POA: Diagnosis not present

## 2019-12-29 DIAGNOSIS — R809 Proteinuria, unspecified: Secondary | ICD-10-CM

## 2019-12-29 DIAGNOSIS — M3214 Glomerular disease in systemic lupus erythematosus: Secondary | ICD-10-CM | POA: Diagnosis not present

## 2019-12-29 DIAGNOSIS — M329 Systemic lupus erythematosus, unspecified: Secondary | ICD-10-CM | POA: Insufficient documentation

## 2019-12-29 DIAGNOSIS — Z79899 Other long term (current) drug therapy: Secondary | ICD-10-CM | POA: Diagnosis not present

## 2019-12-29 LAB — URINALYSIS, COMPLETE (UACMP) WITH MICROSCOPIC
Bacteria, UA: NONE SEEN
Bilirubin Urine: NEGATIVE
Glucose, UA: NEGATIVE mg/dL
Hgb urine dipstick: NEGATIVE
Ketones, ur: NEGATIVE mg/dL
Leukocytes,Ua: NEGATIVE
Nitrite: NEGATIVE
Protein, ur: >=300 mg/dL — AB
Specific Gravity, Urine: 1.017 (ref 1.005–1.030)
pH: 5 (ref 5.0–8.0)

## 2019-12-29 LAB — AST: AST: 11 U/L — ABNORMAL LOW (ref 15–41)

## 2019-12-29 LAB — CBC WITH DIFFERENTIAL/PLATELET
Abs Immature Granulocytes: 0.01 10*3/uL (ref 0.00–0.07)
Basophils Absolute: 0 10*3/uL (ref 0.0–0.1)
Basophils Relative: 1 %
Eosinophils Absolute: 0.1 10*3/uL (ref 0.0–0.5)
Eosinophils Relative: 1 %
HCT: 41.8 % (ref 36.0–46.0)
Hemoglobin: 13.6 g/dL (ref 12.0–15.0)
Immature Granulocytes: 0 %
Lymphocytes Relative: 28 %
Lymphs Abs: 1.1 10*3/uL (ref 0.7–4.0)
MCH: 30.6 pg (ref 26.0–34.0)
MCHC: 32.5 g/dL (ref 30.0–36.0)
MCV: 93.9 fL (ref 80.0–100.0)
Monocytes Absolute: 0.2 10*3/uL (ref 0.1–1.0)
Monocytes Relative: 6 %
Neutro Abs: 2.5 10*3/uL (ref 1.7–7.7)
Neutrophils Relative %: 64 %
Platelets: 249 10*3/uL (ref 150–400)
RBC: 4.45 MIL/uL (ref 3.87–5.11)
RDW: 12.6 % (ref 11.5–15.5)
Smear Review: UNDETERMINED
WBC: 3.9 10*3/uL — ABNORMAL LOW (ref 4.0–10.5)
nRBC: 0 % (ref 0.0–0.2)

## 2019-12-29 LAB — ALT: ALT: 8 U/L (ref 0–44)

## 2019-12-29 NOTE — Telephone Encounter (Signed)
Written by Berniece Pap, FNP on 12/29/2019 11:52 AM EST View Full Comments Seen by patient Faith Camacho on 12/29/2019 12:45 PM

## 2019-12-29 NOTE — Telephone Encounter (Signed)
-----   Message from Berniece Pap, FNP sent at 12/29/2019 11:52 AM EST ----- No leukocytes  in urine.  Over 300 protein. Follow office visit instructions. Red Flags discussed. The patient was given clear instructions to go to ER or return to medical center if any red flags develop, symptoms do not improve, worsen or new problems develop. They verbalized understanding.

## 2019-12-29 NOTE — Progress Notes (Signed)
No leukocytes  in urine.  Over 300 protein. Follow office visit instructions. Red Flags discussed. The patient was given clear instructions to go to ER or return to medical center if any red flags develop, symptoms do not improve, worsen or new problems develop. They verbalized understanding.

## 2019-12-29 NOTE — Patient Instructions (Addendum)
Go to hospital lab for labs rheumatologist ordered.  Schedule with rheumatology keep nephrology appointment next week- CALL  TO SEE IF ABLE TO SEE YOU THIS WEEK.   Proteinuria Proteinuria is when there is too much protein in the urine. Proteins are important for building muscles and bones. Proteins are also needed to fight infections, help the blood to clot, and keep body fluids in balance. Proteinuria may be mild and temporary, or it may be an early sign of kidney disease. The kidneys make urine. Healthy kidneys also keep substances like proteins from leaving the blood and ending up in the urine. What are the causes? This condition may be caused by damage to the kidneys or by temporary causes such as fever or stress. Proteinuria may happen when the kidneys are not working well. Healthy kidneys have filters (glomeruli) that keep proteins out of the urine. Proteinuria may mean that the glomeruli are damaged. The main causes of this type of damage are:  Diabetes.  High blood pressure. Other causes of kidney damage can also cause proteinuria, such as:  Diseases of the immune system, such as lupus, rheumatoid arthritis, sarcoidosis, and Goodpasture syndrome.  Heart disease or heart failure.  Kidney infection.  Certain cancers, including kidney cancer, lymphoma, leukemia, and multiple myeloma.  Amyloidosis. This is a disease that causes abnormal proteins to build up in body tissues.  Reactions to certain medicines, such as NSAIDs.  Injuries or poisons (toxins).  High blood pressure that occurs during pregnancy (preeclampsia and eclampsia). Temporary proteinuria may result from conditions that put stress on the kidneys. These conditions usually do not cause kidney damage. They include:  Fever.  Exposure to cold or heat.  Emotional or physical stress.  Extreme exercise.  Standing for long periods of time. What increases the risk? You are more likely to develop this condition if  you:  Have diabetes.  Have high blood pressure.  Have heart disease or heart failure.  Have an immune disease, cancer, or other disease that affects the kidneys.  Have a family history of kidney disease.  Are 45 years of age or older.  Are overweight.  Are of African American, American [place], Hispanic/Latino, or [place] descent.  Are pregnant.  Have an infection. What are the signs or symptoms? Mild proteinuria may not cause symptoms. As more proteins enter the urine, symptoms of kidney disease may develop, such as:  Foamy urine.  Swelling of the face, abdomen, hands, legs, or feet (edema).  Needing to urinate frequently.  Fatigue.  Difficulty sleeping.  Dry and itchy skin.  Nausea and vomiting.  Muscle cramps.  Shortness of breath. How is this diagnosed? This condition may be diagnosed with a urine test. You may have this test as part of a routine physical exam or because you have symptoms of kidney disease or risk factors for kidney disease. You may also have:  Blood tests to measure the level of a certain substance (creatinine) that increases with kidney disease.  Imaging tests of your kidney, such as a CT scan or an ultrasound, to look for signs of kidney damage. How is this treated? If your proteinuria is mild or temporary, treatment may not be needed for this condition. Your health care provider may show you how to monitor the level of protein in your urine at home. Identifying proteinuria early is important so that the cause of the condition can be treated. Treatment for this condition depends on the cause of your proteinuria. Treatment may include:  Making diet and lifestyle changes.  Getting blood pressure under control.  Getting blood sugar under control, if you have diabetes.  Managing any other medical conditions you have that affect your kidneys.  Giving birth, if you are pregnant.  Avoiding medicines that damage your kidneys. In  severe cases, kidney disease may need to be treated with medicines or dialysis. Follow these instructions at home: Activity  Return to your normal activities as told by your health care provider. Ask your health care provider what activities are safe for you.  Ask your health care provider to recommend an exercise program. General instructions  Check your protein levels at home if directed by your health care provider.  Follow instructions from your health care provider about eating or drinking restrictions.  If you are overweight, ask your health care provider about diets that can help you get to a healthy weight.  Take over-the-counter and prescription medicines only as told by your health care provider.  Keep all follow-up visits as told by your health care provider. This is important. Contact a health care provider if:  You have new symptoms.  Your symptoms get worse or do not improve. Get help right away if you:  Have back pain.  Have diarrhea.  Vomit.  Have a fever.  Have a rash. Summary  Proteinuria is when there is too much protein in the urine.  Proteinuria may be mild and temporary, or it may be an early sign of kidney disease.  This condition may be diagnosed with a urine test.  Treatment for this condition depends on the cause of your proteinuria.  Treatment may include diet and lifestyle changes, blood pressure and blood sugar management, and avoiding medicines that may damage the kidneys. If the proteinuria is severe, it may need to be treated with medicines or dialysis. This information is not intended to replace advice given to you by your health care provider. Make sure you discuss any questions you have with your health care provider. Document Revised: 09/15/2017 Document Reviewed: 09/15/2017 Elsevier Patient Education  [place].
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
## 2019-12-30 LAB — URINE CULTURE: Culture: NO GROWTH

## 2019-12-30 LAB — ANTI-DNA ANTIBODY, DOUBLE-STRANDED: ds DNA Ab: 15 IU/mL — ABNORMAL HIGH (ref 0–9)

## 2019-12-30 LAB — BETA-2-GLYCOPROTEIN I ABS, IGG/M/A
Beta-2 Glyco I IgG: <9 GPI IgG units (ref 0–20)
Beta-2-Glycoprotein I IgA: <9 GPI IgA units (ref 0–25)
Beta-2-Glycoprotein I IgM: <9 GPI IgM units (ref 0–32)

## 2019-12-30 LAB — C3 COMPLEMENT: C3 Complement: 116 mg/dL (ref 82–167)

## 2019-12-30 LAB — C4 COMPLEMENT: Complement C4, Body Fluid: 36 mg/dL (ref 12–38)

## 2019-12-30 NOTE — Progress Notes (Signed)
No growth in urine

## 2020-01-02 LAB — CARDIOLIPIN ANTIBODIES, IGG, IGM, IGA
Anticardiolipin IgA: <9 APL U/mL (ref 0–11)
Anticardiolipin IgG: 10 GPL U/mL (ref 0–14)
Anticardiolipin IgM: 9 MPL U/mL (ref 0–12)

## 2020-01-02 LAB — TACROLIMUS LEVEL: Tacrolimus (FK506) - LabCorp: 2.1 ng/mL (ref 2.0–20.0)

## 2020-01-04 ENCOUNTER — Encounter: Payer: Self-pay | Admitting: Obstetrics and Gynecology

## 2020-01-04 DIAGNOSIS — M329 Systemic lupus erythematosus, unspecified: Secondary | ICD-10-CM | POA: Diagnosis not present

## 2020-01-04 DIAGNOSIS — N052 Unspecified nephritic syndrome with diffuse membranous glomerulonephritis: Secondary | ICD-10-CM | POA: Diagnosis not present

## 2020-01-07 LAB — MISC LABCORP TEST (SEND OUT)

## 2020-01-24 ENCOUNTER — Encounter: Payer: Self-pay | Admitting: Family Medicine

## 2020-01-24 ENCOUNTER — Ambulatory Visit: Payer: Medicaid Other | Admitting: Family Medicine

## 2020-01-31 ENCOUNTER — Other Ambulatory Visit: Payer: Self-pay | Admitting: *Deleted

## 2020-01-31 NOTE — Patient Outreach (Signed)
Care Coordination  01/31/2020  Faith Camacho 06-18-1990 [id number]    Medicaid Managed Care   Unsuccessful Outreach Note  01/31/2020 Name: Faith Camacho MRN: [id number] DOB: [birth year]  Referred by: [name], MD Reason for referral : High Risk Managed Medicaid (Unsuccessful RNCM initial outreach)   An unsuccessful telephone outreach was attempted today. The patient was referred to the case management team for assistance with care management and care coordination.   Follow Up Plan: RNCM will attempt telephone outreach again over the next 14 days  Estanislado Emms RN, BSN [place]  [facility]

## 2020-01-31 NOTE — Patient Instructions (Signed)
Visit Information  Ms. Faith Camacho  - as a part of your Medicaid benefit, you are eligible for care management and care coordination services at no cost or copay. I was unable to reach you by phone today but would be happy to help you with your health related needs. Please feel free to call me @ [phone].   A member of the Managed Medicaid care management team will reach out to you again over the next 7-14 days.   Estanislado Emms RN, BSN [place]   [facility]

## 2020-02-08 ENCOUNTER — Other Ambulatory Visit: Payer: Self-pay | Admitting: *Deleted

## 2020-02-08 NOTE — Patient Outreach (Signed)
Care Coordination  02/08/2020  Faith Camacho 10-02-1990 [id number]    Medicaid Managed Care   Unsuccessful Outreach Note  02/08/2020 Name: Faith Camacho MRN: [id number] DOB: 06-20-1990  Referred by: [name], MD Reason for referral : High Risk Managed Medicaid (Unsuccessful initial outreach 2nd attempt)   A second unsuccessful telephone outreach was attempted today. The patient was referred to the case management team for assistance with care management and care coordination.   Follow Up Plan: RNCM will reach out again over the next 7-14 days for initial outreach  Estanislado Emms RN, BSN [place]  [facility] [employer]

## 2020-02-08 NOTE — Patient Instructions (Signed)
Visit Information ° °Ms. Faith Camacho  - as a part of your Medicaid benefit, you are eligible for care management and care coordination services at no cost or copay. I was unable to reach you by phone today but would be happy to help you with your health related needs. Please feel free to call me @ [phone].  ° °A member of the Managed Medicaid care management team will reach out to you again over the next 7-14 days.  ° °Praneel Haisley RN, BSN °[place]   [facility] °RN Care Coordinator ° ° °

## 2020-02-13 ENCOUNTER — Other Ambulatory Visit: Payer: Self-pay

## 2020-02-13 ENCOUNTER — Ambulatory Visit
Admission: EM | Admit: 2020-02-13 | Discharge: 2020-02-13 | Disposition: A | Discharge status: Home / Self Care | Payer: Medicaid Other | Attending: Emergency Medicine | Admitting: Emergency Medicine

## 2020-02-13 DIAGNOSIS — U071 COVID-19: Secondary | ICD-10-CM | POA: Insufficient documentation

## 2020-02-13 DIAGNOSIS — J069 Acute upper respiratory infection, unspecified: Secondary | ICD-10-CM | POA: Diagnosis not present

## 2020-02-13 MED ORDER — PREDNISONE 20 MG PO TABS: 20.0000 mg | ORAL_TABLET | Freq: Every day | ORAL | 0 refills | Status: AC

## 2020-02-13 NOTE — ED Triage Notes (Signed)
Patient states that she has been having a cough with congestion and shortness of breath and headaches x 7 days. States that she has not had covid testing. Patient has been vaccinated against covid.

## 2020-02-13 NOTE — Discharge Instructions (Signed)
You were seen for cough and chest tightness and are being treated for the same.   Stop your current dose of prednisone and start the new prednisone prescription for the next 3 days.  Continue using your albuterol and Qvar as directed.  Monitor symptoms to make sure that you get better.  You were tested for COVID-19. If you know you were exposed to a confirmed case of COVID-19, continue quarantine until your test results are available.  Interact as little as possible until COVID-19 test results are available.  Continue to keep her social distance of 6 feet from others, wash hands frequently and wear facemask when indoors or when you are unable to social distance in outdoor settings.  If you develop any symptoms, reach out to the urgent care for further instructions.  If your test results are positive, a member of the urgent care team will reach out to you with further instructions. If you have any further questions, please don't hesitate to reach out to the urgent care clinic.  Over the counter medications that will help your symptoms: Flonase (runny nose, congestion), Zyrtec or Xyzal (postnasal drip, sneezing), Tylenol/Ibuprofen (fever and body aches).  Please sign up for MyChart to access your lab results.  Take care, Dr. Sharlet Salina, NP-c
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
## 2020-02-14 LAB — SARS CORONAVIRUS 2 (TAT 6-24 HRS): SARS Coronavirus 2: POSITIVE — AB

## 2020-02-22 ENCOUNTER — Other Ambulatory Visit: Payer: Self-pay | Admitting: *Deleted

## 2020-02-22 NOTE — Patient Outreach (Signed)
Care Coordination  02/22/2020  Daliah Chaudoin 08-12-1990 [id number]    Medicaid Managed Care   Unsuccessful Outreach Note  02/22/2020 Name: Mayia Megill MRN: [id number] DOB: 1990-10-10  Referred by: [name], MD Reason for referral : High Risk Managed Medicaid (Unsuccessful initial outreach 3rd attempt)   Third unsuccessful telephone outreach was attempted today. The patient was referred to the case management team for assistance with care management and care coordination. The patient's primary care provider has been notified of our unsuccessful attempts to make or maintain contact with the patient. The care management team is pleased to engage with this patient at any time in the future should he/she be interested in assistance from the care management team.   Follow Up Plan: We have been unable to make contact with the patient for initial outreach. The care management team is available for case management/care coordination after provider conversation with the patient regarding recommendation for care management engagement and subsequent re-referral to the care management team.   Estanislado Emms RN, BSN [place]  [facility] RN Care Coordinator

## 2020-04-05 DIAGNOSIS — M3214 Glomerular disease in systemic lupus erythematosus: Secondary | ICD-10-CM | POA: Diagnosis not present

## 2020-04-05 DIAGNOSIS — M329 Systemic lupus erythematosus, unspecified: Secondary | ICD-10-CM | POA: Diagnosis not present

## 2020-04-05 DIAGNOSIS — N052 Unspecified nephritic syndrome with diffuse membranous glomerulonephritis: Secondary | ICD-10-CM | POA: Diagnosis not present

## 2020-04-05 DIAGNOSIS — Z79899 Other long term (current) drug therapy: Secondary | ICD-10-CM | POA: Diagnosis not present

## 2020-06-17 DIAGNOSIS — S20212A Contusion of left front wall of thorax, initial encounter: Secondary | ICD-10-CM | POA: Diagnosis not present

## 2020-06-17 DIAGNOSIS — Z3201 Encounter for pregnancy test, result positive: Secondary | ICD-10-CM | POA: Diagnosis not present

## 2020-06-17 DIAGNOSIS — S0990XA Unspecified injury of head, initial encounter: Secondary | ICD-10-CM | POA: Diagnosis not present

## 2020-06-17 DIAGNOSIS — S0011XA Contusion of right eyelid and periocular area, initial encounter: Secondary | ICD-10-CM | POA: Diagnosis not present

## 2020-06-20 DIAGNOSIS — Z6981 Encounter for mental health services for victim of other abuse: Secondary | ICD-10-CM | POA: Diagnosis not present

## 2020-06-20 DIAGNOSIS — M3214 Glomerular disease in systemic lupus erythematosus: Secondary | ICD-10-CM | POA: Diagnosis not present

## 2020-06-20 DIAGNOSIS — N189 Chronic kidney disease, unspecified: Secondary | ICD-10-CM | POA: Diagnosis not present

## 2020-06-20 DIAGNOSIS — Z3201 Encounter for pregnancy test, result positive: Secondary | ICD-10-CM | POA: Diagnosis not present

## 2020-06-20 DIAGNOSIS — M329 Systemic lupus erythematosus, unspecified: Secondary | ICD-10-CM | POA: Diagnosis not present

## 2020-06-25 DIAGNOSIS — Z3A01 Less than 8 weeks gestation of pregnancy: Secondary | ICD-10-CM | POA: Diagnosis not present

## 2020-06-25 DIAGNOSIS — Z3A00 Weeks of gestation of pregnancy not specified: Secondary | ICD-10-CM | POA: Diagnosis not present

## 2020-06-25 DIAGNOSIS — R1030 Lower abdominal pain, unspecified: Secondary | ICD-10-CM | POA: Diagnosis not present

## 2020-06-25 DIAGNOSIS — O039 Complete or unspecified spontaneous abortion without complication: Secondary | ICD-10-CM | POA: Diagnosis not present

## 2020-06-25 DIAGNOSIS — O208 Other hemorrhage in early pregnancy: Secondary | ICD-10-CM | POA: Diagnosis not present

## 2020-06-26 DIAGNOSIS — R109 Unspecified abdominal pain: Secondary | ICD-10-CM | POA: Diagnosis not present

## 2020-06-26 DIAGNOSIS — O3680X0 Pregnancy with inconclusive fetal viability, not applicable or unspecified: Secondary | ICD-10-CM | POA: Diagnosis not present

## 2020-06-27 DIAGNOSIS — O021 Missed abortion: Secondary | ICD-10-CM | POA: Diagnosis not present

## 2020-06-28 DIAGNOSIS — Z79899 Other long term (current) drug therapy: Secondary | ICD-10-CM | POA: Diagnosis not present

## 2020-06-28 DIAGNOSIS — M329 Systemic lupus erythematosus, unspecified: Secondary | ICD-10-CM | POA: Diagnosis not present

## 2020-06-30 DIAGNOSIS — O039 Complete or unspecified spontaneous abortion without complication: Secondary | ICD-10-CM | POA: Diagnosis not present

## 2020-06-30 DIAGNOSIS — R102 Pelvic and perineal pain: Secondary | ICD-10-CM | POA: Diagnosis not present

## 2020-06-30 DIAGNOSIS — O034 Incomplete spontaneous abortion without complication: Secondary | ICD-10-CM | POA: Diagnosis not present

## 2020-06-30 DIAGNOSIS — N938 Other specified abnormal uterine and vaginal bleeding: Secondary | ICD-10-CM | POA: Diagnosis not present

## 2020-07-06 DIAGNOSIS — I10 Essential (primary) hypertension: Secondary | ICD-10-CM | POA: Diagnosis not present

## 2020-07-06 DIAGNOSIS — M3214 Glomerular disease in systemic lupus erythematosus: Secondary | ICD-10-CM | POA: Diagnosis not present

## 2020-07-06 DIAGNOSIS — N182 Chronic kidney disease, stage 2 (mild): Secondary | ICD-10-CM | POA: Diagnosis not present

## 2020-07-07 DIAGNOSIS — R069 Unspecified abnormalities of breathing: Secondary | ICD-10-CM | POA: Diagnosis not present

## 2020-07-07 DIAGNOSIS — M329 Systemic lupus erythematosus, unspecified: Secondary | ICD-10-CM | POA: Diagnosis not present

## 2020-07-07 DIAGNOSIS — I82441 Acute embolism and thrombosis of right tibial vein: Secondary | ICD-10-CM | POA: Diagnosis not present

## 2020-07-07 DIAGNOSIS — J189 Pneumonia, unspecified organism: Secondary | ICD-10-CM | POA: Diagnosis not present

## 2020-07-07 DIAGNOSIS — I82401 Acute embolism and thrombosis of unspecified deep veins of right lower extremity: Secondary | ICD-10-CM | POA: Diagnosis not present

## 2020-07-07 DIAGNOSIS — R809 Proteinuria, unspecified: Secondary | ICD-10-CM | POA: Diagnosis not present

## 2020-07-08 DIAGNOSIS — J449 Chronic obstructive pulmonary disease, unspecified: Secondary | ICD-10-CM | POA: Diagnosis not present

## 2020-07-08 DIAGNOSIS — I82441 Acute embolism and thrombosis of right tibial vein: Secondary | ICD-10-CM | POA: Diagnosis not present

## 2020-07-08 DIAGNOSIS — M3214 Glomerular disease in systemic lupus erythematosus: Secondary | ICD-10-CM | POA: Diagnosis not present

## 2020-07-08 DIAGNOSIS — G8929 Other chronic pain: Secondary | ICD-10-CM | POA: Diagnosis not present

## 2020-07-08 DIAGNOSIS — R069 Unspecified abnormalities of breathing: Secondary | ICD-10-CM | POA: Diagnosis not present

## 2020-07-08 DIAGNOSIS — N182 Chronic kidney disease, stage 2 (mild): Secondary | ICD-10-CM | POA: Diagnosis not present

## 2020-07-09 DIAGNOSIS — G8929 Other chronic pain: Secondary | ICD-10-CM | POA: Diagnosis not present

## 2020-07-09 DIAGNOSIS — N182 Chronic kidney disease, stage 2 (mild): Secondary | ICD-10-CM | POA: Diagnosis not present

## 2020-07-09 DIAGNOSIS — I82441 Acute embolism and thrombosis of right tibial vein: Secondary | ICD-10-CM | POA: Diagnosis not present

## 2020-07-09 DIAGNOSIS — M3214 Glomerular disease in systemic lupus erythematosus: Secondary | ICD-10-CM | POA: Diagnosis not present

## 2020-07-09 DIAGNOSIS — J449 Chronic obstructive pulmonary disease, unspecified: Secondary | ICD-10-CM | POA: Diagnosis not present

## 2020-07-12 DIAGNOSIS — R0609 Other forms of dyspnea: Secondary | ICD-10-CM | POA: Diagnosis not present

## 2020-07-18 DIAGNOSIS — J189 Pneumonia, unspecified organism: Secondary | ICD-10-CM | POA: Diagnosis not present

## 2020-07-18 DIAGNOSIS — R918 Other nonspecific abnormal finding of lung field: Secondary | ICD-10-CM | POA: Diagnosis not present

## 2020-07-18 DIAGNOSIS — R0602 Shortness of breath: Secondary | ICD-10-CM | POA: Diagnosis not present

## 2020-07-26 DIAGNOSIS — R0602 Shortness of breath: Secondary | ICD-10-CM | POA: Diagnosis not present

## 2020-07-26 DIAGNOSIS — R918 Other nonspecific abnormal finding of lung field: Secondary | ICD-10-CM | POA: Diagnosis not present

## 2020-07-26 DIAGNOSIS — R0989 Other specified symptoms and signs involving the circulatory and respiratory systems: Secondary | ICD-10-CM | POA: Diagnosis not present

## 2020-07-26 DIAGNOSIS — M79661 Pain in right lower leg: Secondary | ICD-10-CM | POA: Diagnosis not present

## 2020-07-30 DIAGNOSIS — R918 Other nonspecific abnormal finding of lung field: Secondary | ICD-10-CM | POA: Diagnosis not present

## 2020-07-30 DIAGNOSIS — R0789 Other chest pain: Secondary | ICD-10-CM | POA: Diagnosis not present

## 2020-07-30 DIAGNOSIS — R531 Weakness: Secondary | ICD-10-CM | POA: Diagnosis not present

## 2020-07-30 DIAGNOSIS — R519 Headache, unspecified: Secondary | ICD-10-CM | POA: Diagnosis not present

## 2020-07-30 DIAGNOSIS — Z28310 Unvaccinated for covid-19: Secondary | ICD-10-CM | POA: Diagnosis not present

## 2020-07-30 DIAGNOSIS — R0602 Shortness of breath: Secondary | ICD-10-CM | POA: Diagnosis not present

## 2020-07-31 DIAGNOSIS — G43901 Migraine, unspecified, not intractable, with status migrainosus: Secondary | ICD-10-CM | POA: Diagnosis not present

## 2020-07-31 DIAGNOSIS — J449 Chronic obstructive pulmonary disease, unspecified: Secondary | ICD-10-CM | POA: Diagnosis not present

## 2020-07-31 DIAGNOSIS — I2721 Secondary pulmonary arterial hypertension: Secondary | ICD-10-CM | POA: Diagnosis not present

## 2020-08-02 DIAGNOSIS — F4310 Post-traumatic stress disorder, unspecified: Secondary | ICD-10-CM | POA: Diagnosis not present

## 2020-08-22 DIAGNOSIS — J984 Other disorders of lung: Secondary | ICD-10-CM | POA: Diagnosis not present

## 2020-08-22 DIAGNOSIS — R911 Solitary pulmonary nodule: Secondary | ICD-10-CM | POA: Diagnosis not present

## 2020-08-22 DIAGNOSIS — G43009 Migraine without aura, not intractable, without status migrainosus: Secondary | ICD-10-CM | POA: Diagnosis not present

## 2020-08-22 DIAGNOSIS — J449 Chronic obstructive pulmonary disease, unspecified: Secondary | ICD-10-CM | POA: Diagnosis not present

## 2020-08-23 DIAGNOSIS — F4310 Post-traumatic stress disorder, unspecified: Secondary | ICD-10-CM | POA: Diagnosis not present

## 2020-08-31 DIAGNOSIS — F4310 Post-traumatic stress disorder, unspecified: Secondary | ICD-10-CM | POA: Diagnosis not present

## 2020-09-07 DIAGNOSIS — F4310 Post-traumatic stress disorder, unspecified: Secondary | ICD-10-CM | POA: Diagnosis not present

## 2020-09-08 DIAGNOSIS — J309 Allergic rhinitis, unspecified: Secondary | ICD-10-CM | POA: Diagnosis not present

## 2020-09-14 DIAGNOSIS — F4310 Post-traumatic stress disorder, unspecified: Secondary | ICD-10-CM | POA: Diagnosis not present

## 2020-09-19 DIAGNOSIS — M3210 Systemic lupus erythematosus, organ or system involvement unspecified: Secondary | ICD-10-CM | POA: Diagnosis not present

## 2020-09-19 DIAGNOSIS — M1A9XX0 Chronic gout, unspecified, without tophus (tophi): Secondary | ICD-10-CM | POA: Diagnosis not present

## 2020-09-19 DIAGNOSIS — R809 Proteinuria, unspecified: Secondary | ICD-10-CM | POA: Diagnosis not present

## 2020-09-19 DIAGNOSIS — D84821 Immunodeficiency due to drugs: Secondary | ICD-10-CM | POA: Diagnosis not present

## 2020-09-19 DIAGNOSIS — Z7952 Long term (current) use of systemic steroids: Secondary | ICD-10-CM | POA: Diagnosis not present

## 2020-09-19 DIAGNOSIS — J849 Interstitial pulmonary disease, unspecified: Secondary | ICD-10-CM | POA: Diagnosis not present

## 2020-09-19 DIAGNOSIS — Z79899 Other long term (current) drug therapy: Secondary | ICD-10-CM | POA: Diagnosis not present

## 2020-09-26 DIAGNOSIS — F4310 Post-traumatic stress disorder, unspecified: Secondary | ICD-10-CM | POA: Diagnosis not present

## 2020-10-19 DIAGNOSIS — J302 Other seasonal allergic rhinitis: Secondary | ICD-10-CM | POA: Diagnosis not present

## 2020-10-19 DIAGNOSIS — Z7952 Long term (current) use of systemic steroids: Secondary | ICD-10-CM | POA: Diagnosis not present

## 2020-10-19 DIAGNOSIS — M3214 Glomerular disease in systemic lupus erythematosus: Secondary | ICD-10-CM | POA: Diagnosis not present

## 2020-10-19 DIAGNOSIS — J449 Chronic obstructive pulmonary disease, unspecified: Secondary | ICD-10-CM | POA: Diagnosis not present

## 2020-10-19 DIAGNOSIS — Z79899 Other long term (current) drug therapy: Secondary | ICD-10-CM | POA: Diagnosis not present

## 2020-10-19 DIAGNOSIS — R911 Solitary pulmonary nodule: Secondary | ICD-10-CM | POA: Diagnosis not present

## 2020-10-19 DIAGNOSIS — J8489 Other specified interstitial pulmonary diseases: Secondary | ICD-10-CM | POA: Diagnosis not present

## 2020-10-19 DIAGNOSIS — Z23 Encounter for immunization: Secondary | ICD-10-CM | POA: Diagnosis not present

## 2020-10-19 DIAGNOSIS — D84821 Immunodeficiency due to drugs: Secondary | ICD-10-CM | POA: Diagnosis not present

## 2020-10-19 DIAGNOSIS — I82461 Acute embolism and thrombosis of right calf muscular vein: Secondary | ICD-10-CM | POA: Diagnosis not present

## 2020-10-19 DIAGNOSIS — R0600 Dyspnea, unspecified: Secondary | ICD-10-CM | POA: Diagnosis not present

## 2020-11-03 DIAGNOSIS — M329 Systemic lupus erythematosus, unspecified: Secondary | ICD-10-CM | POA: Diagnosis not present

## 2020-11-03 DIAGNOSIS — H5213 Myopia, bilateral: Secondary | ICD-10-CM | POA: Diagnosis not present

## 2020-11-03 DIAGNOSIS — Z79899 Other long term (current) drug therapy: Secondary | ICD-10-CM | POA: Diagnosis not present

## 2020-11-03 IMAGING — CR DG THORACIC SPINE 2V
3 series · 3 of 3 positions shown · non-contrast
Comparison: Chest x-ray dated 08/01/2017

CLINICAL DATA: Back pain for 2 days. No known injury.

EXAM:
THORACIC SPINE 2 VIEWS

[t-spine ap]
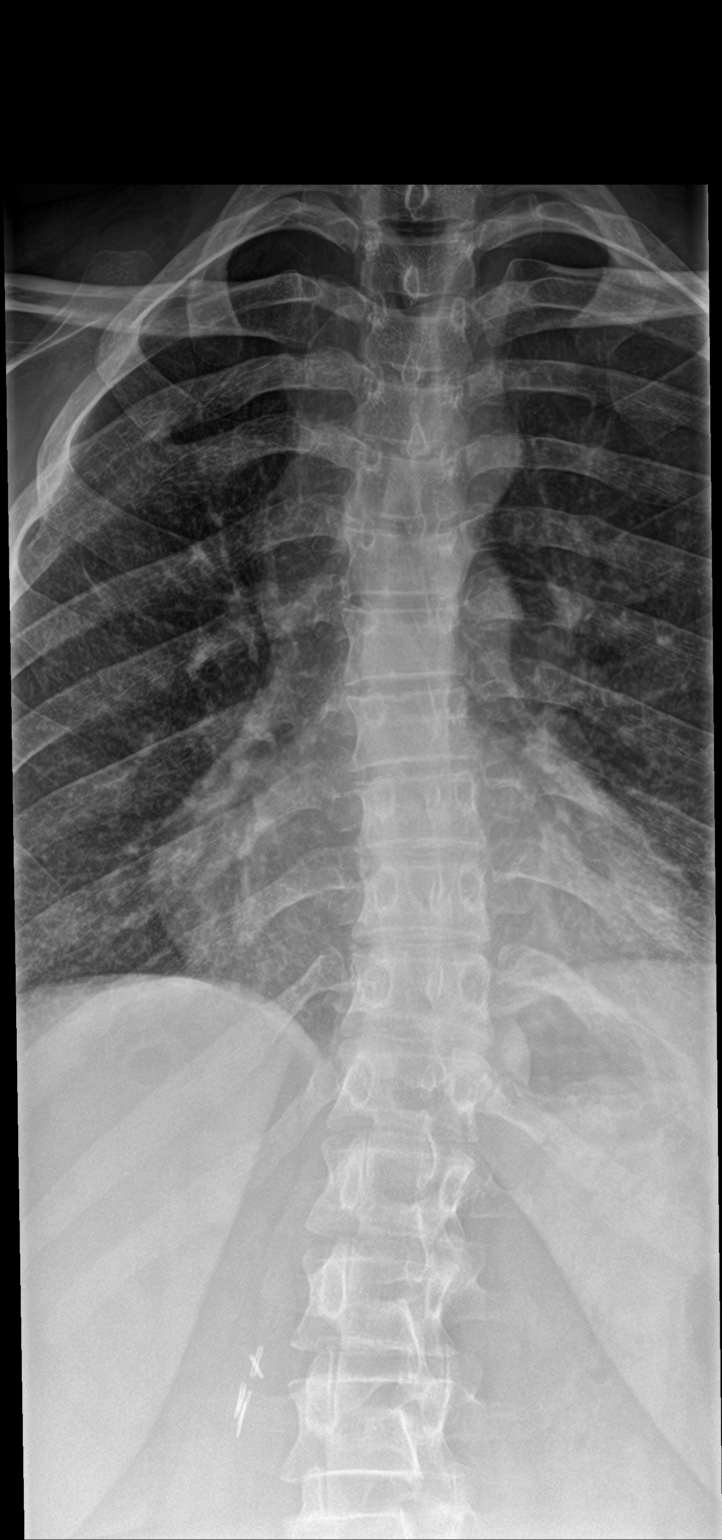

[t-spine lat]
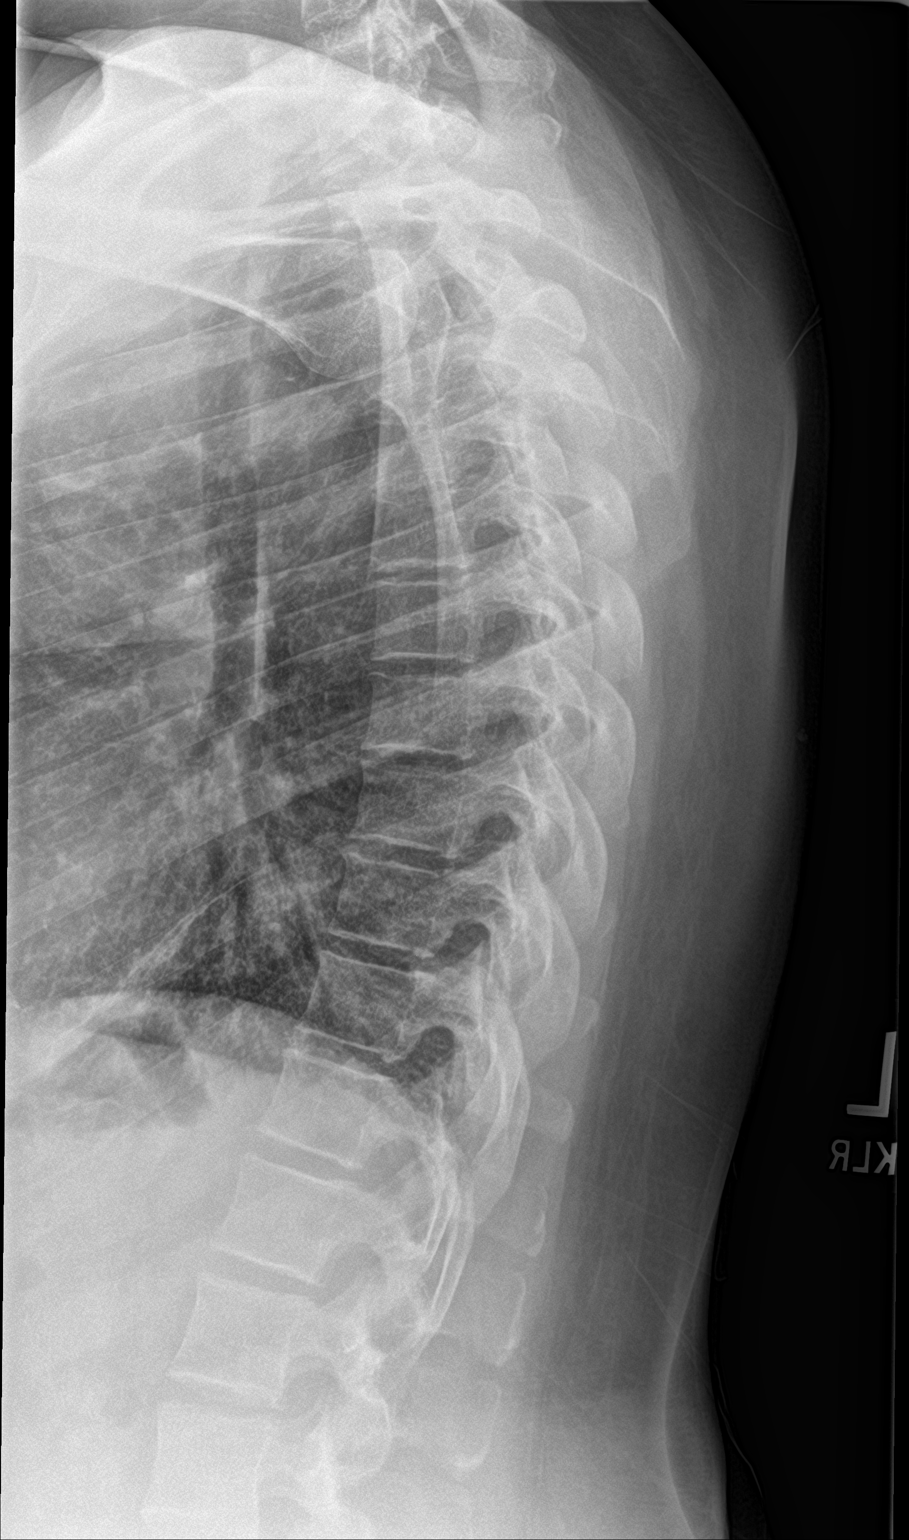

[t-spine swimmers]
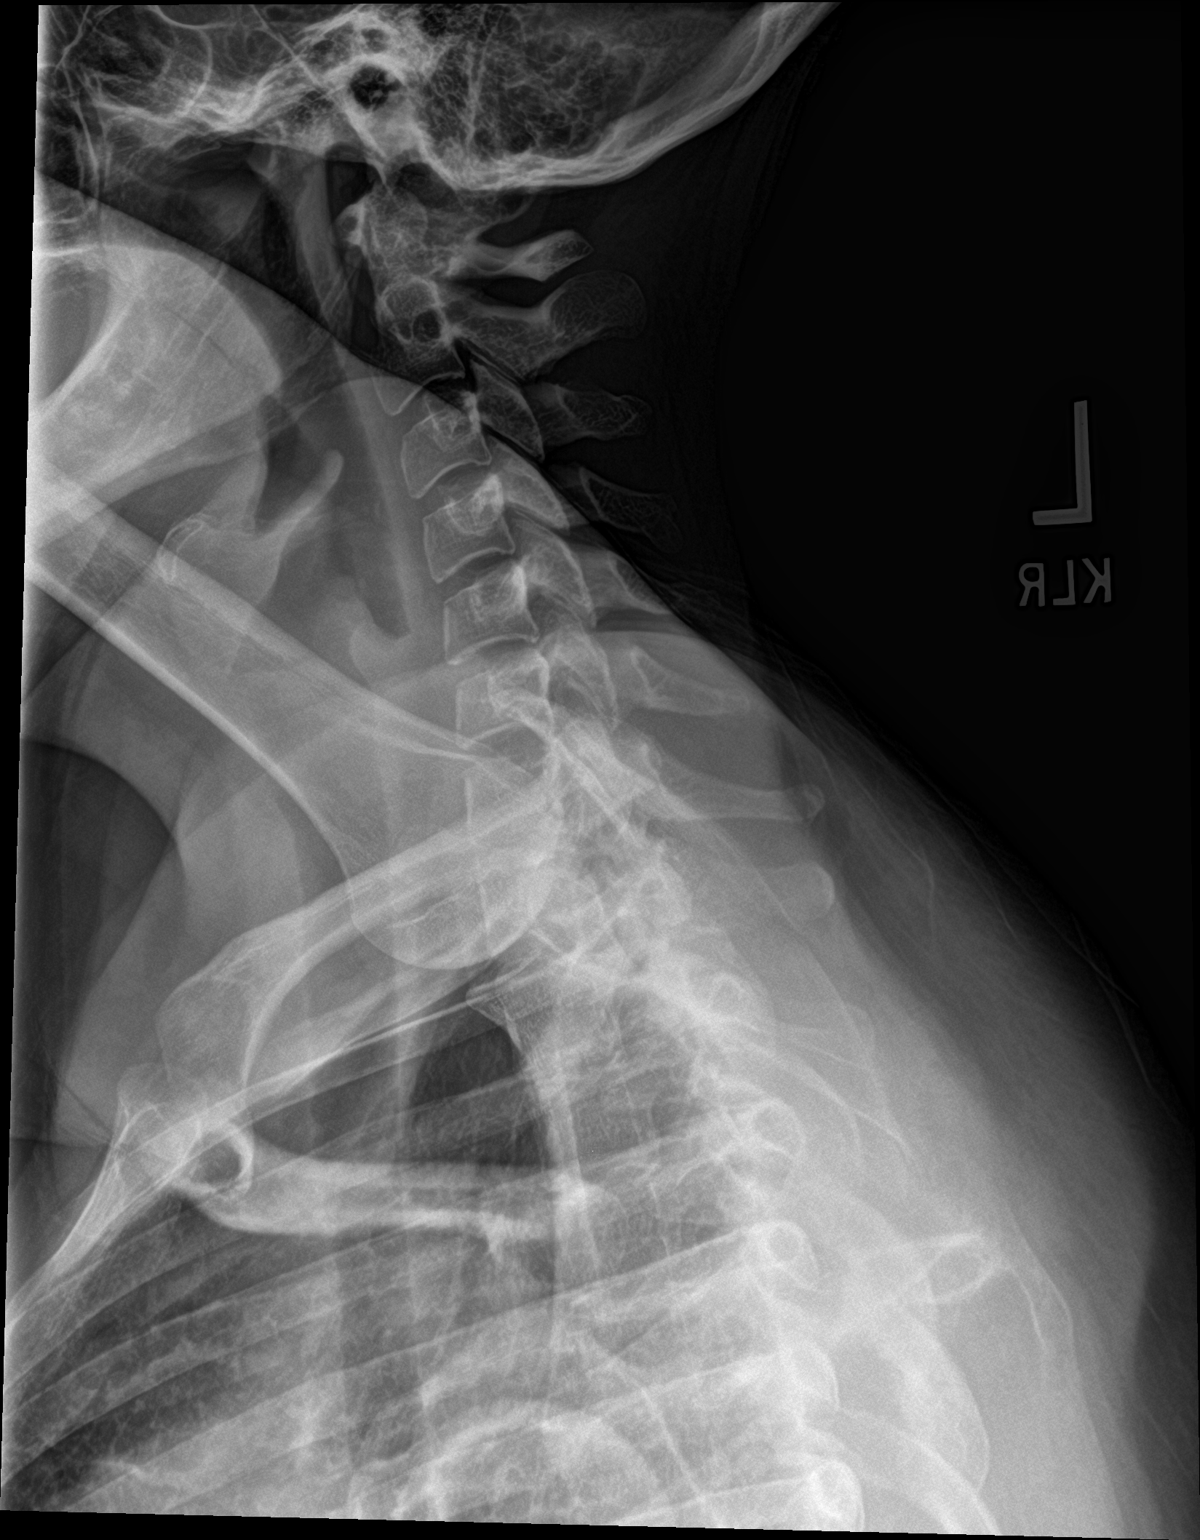

[3 of 3 positions shown; findings below may reference images not displayed]

FINDINGS: There is a mild chronic thoracolumbar scoliosis, unchanged since the
prior study. No fracture, bone destruction, or disc space narrowing.
Fusion of the posterior aspects of the right fourth and fifth ribs
which may be congenital.
IMPRESSION: Stable mild thoracolumbar scoliosis. No acute abnormalities.

## 2020-11-17 DIAGNOSIS — M79661 Pain in right lower leg: Secondary | ICD-10-CM | POA: Diagnosis not present

## 2020-11-17 DIAGNOSIS — M7989 Other specified soft tissue disorders: Secondary | ICD-10-CM | POA: Diagnosis not present

## 2020-11-17 DIAGNOSIS — R079 Chest pain, unspecified: Secondary | ICD-10-CM | POA: Diagnosis not present

## 2020-11-17 DIAGNOSIS — R0789 Other chest pain: Secondary | ICD-10-CM | POA: Diagnosis not present

## 2020-11-17 DIAGNOSIS — L02415 Cutaneous abscess of right lower limb: Secondary | ICD-10-CM | POA: Diagnosis not present

## 2020-11-17 DIAGNOSIS — M7121 Synovial cyst of popliteal space [Baker], right knee: Secondary | ICD-10-CM | POA: Diagnosis not present

## 2020-11-17 DIAGNOSIS — Z8739 Personal history of other diseases of the musculoskeletal system and connective tissue: Secondary | ICD-10-CM | POA: Diagnosis not present

## 2020-11-20 ENCOUNTER — Telehealth: Payer: Self-pay

## 2020-11-20 DIAGNOSIS — M25561 Pain in right knee: Secondary | ICD-10-CM | POA: Diagnosis not present

## 2020-11-20 NOTE — Telephone Encounter (Signed)
Transition Care Management Follow-up Telephone Call Date of discharge and from where: 11/17/2020 from [facility] How have you been since you were released from the hospital? Pt completed hospital follow up with Ortho today.  Any questions or concerns? No

## 2020-11-30 DIAGNOSIS — M545 Low back pain, unspecified: Secondary | ICD-10-CM | POA: Diagnosis not present

## 2020-11-30 DIAGNOSIS — N760 Acute vaginitis: Secondary | ICD-10-CM | POA: Diagnosis not present

## 2020-12-14 DIAGNOSIS — M79661 Pain in right lower leg: Secondary | ICD-10-CM | POA: Diagnosis not present

## 2020-12-14 DIAGNOSIS — M79604 Pain in right leg: Secondary | ICD-10-CM | POA: Diagnosis not present

## 2020-12-14 DIAGNOSIS — M7989 Other specified soft tissue disorders: Secondary | ICD-10-CM | POA: Diagnosis not present

## 2020-12-14 DIAGNOSIS — M329 Systemic lupus erythematosus, unspecified: Secondary | ICD-10-CM | POA: Diagnosis not present

## 2020-12-14 DIAGNOSIS — M7121 Synovial cyst of popliteal space [Baker], right knee: Secondary | ICD-10-CM | POA: Diagnosis not present

## 2020-12-16 IMAGING — CR DG CHEST 2V
1 series · 2 of 2 positions shown · non-contrast
Comparison: Radiograph 08/01/2017

CLINICAL DATA: Chest pain, body aches and low-grade fevers, history
of SLE, ILD, CKD

EXAM:
CHEST - 2 VIEW

[Series 1: dg chest 2 view · 0.14mm/px · 2 of 2 slices shown]
[im 1/2]
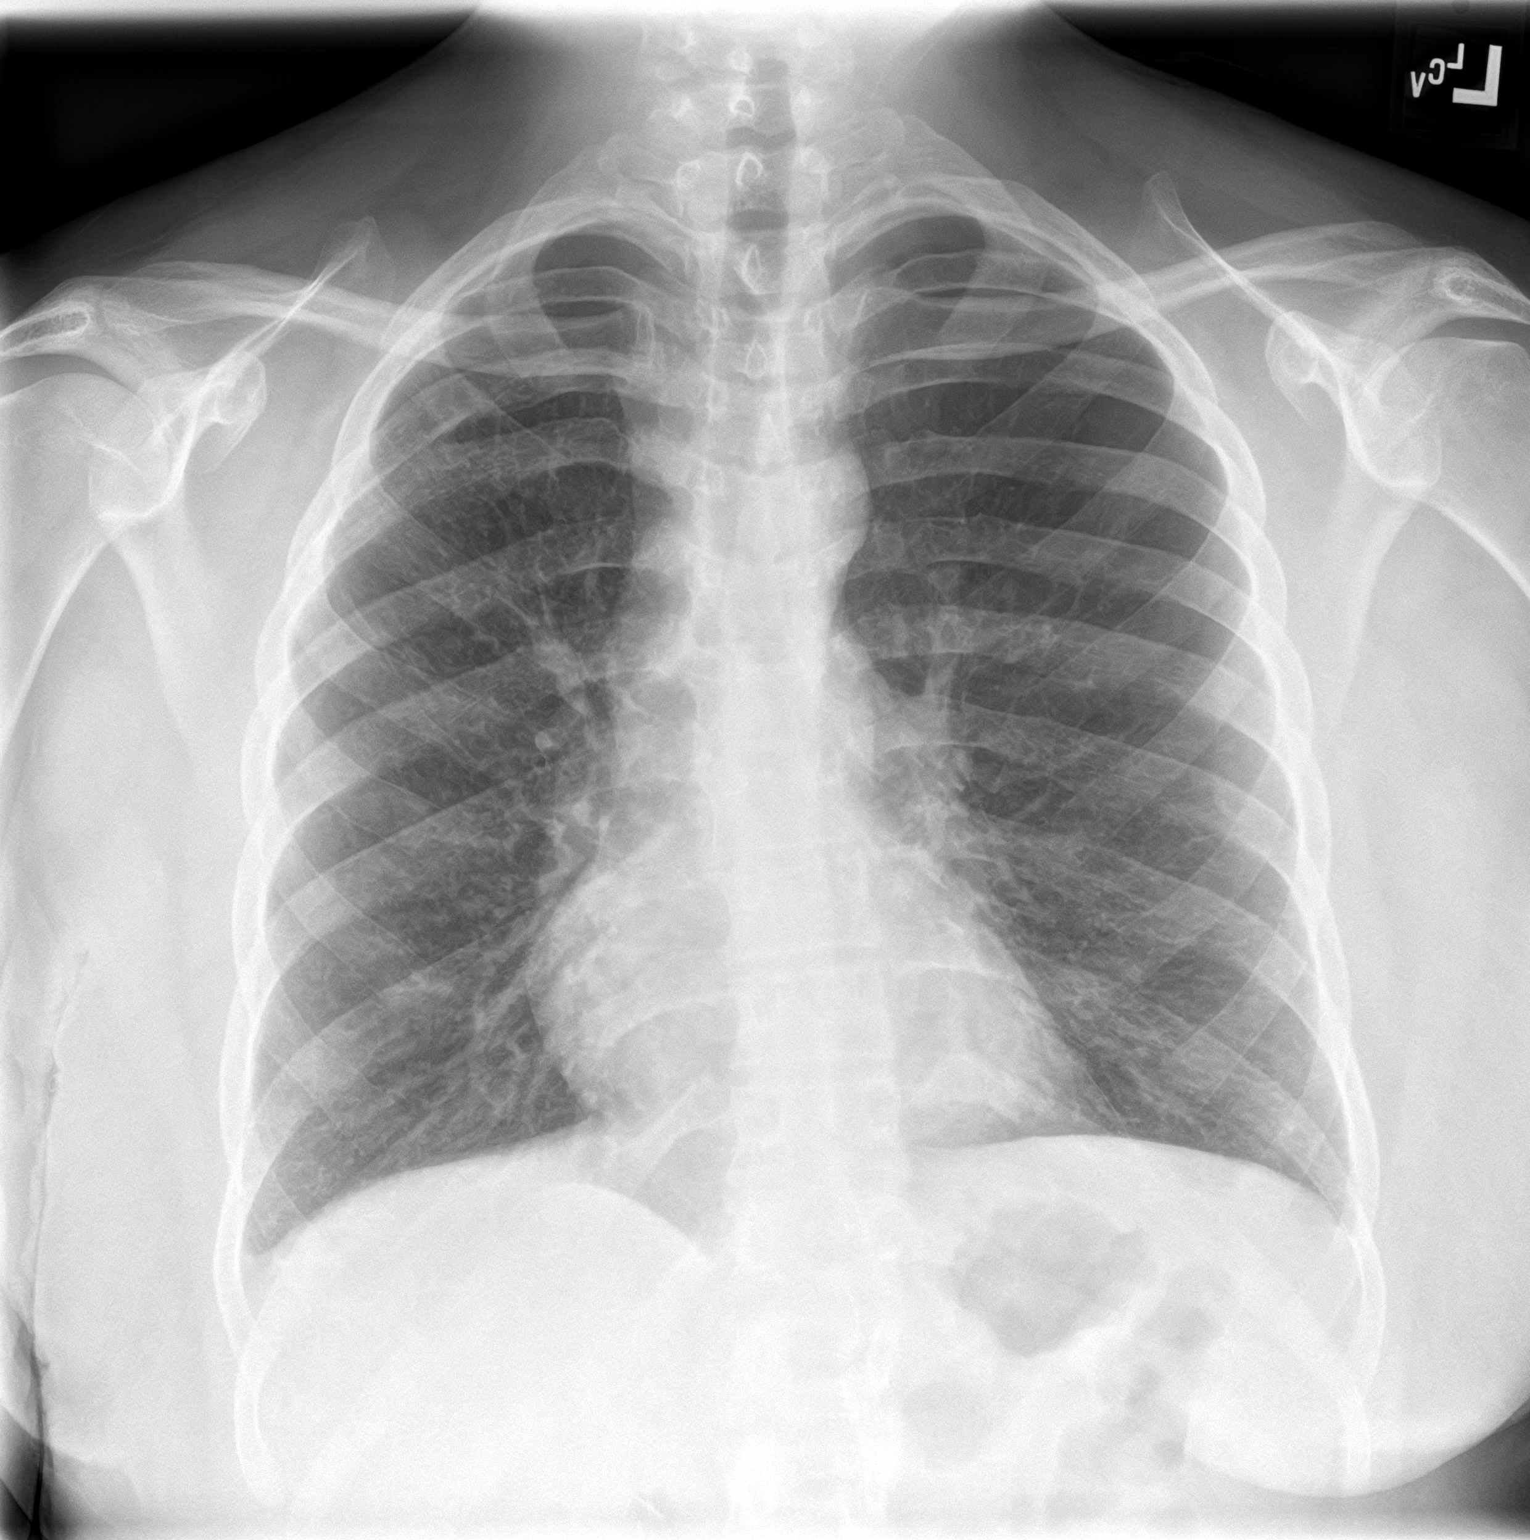
[im 2/2]
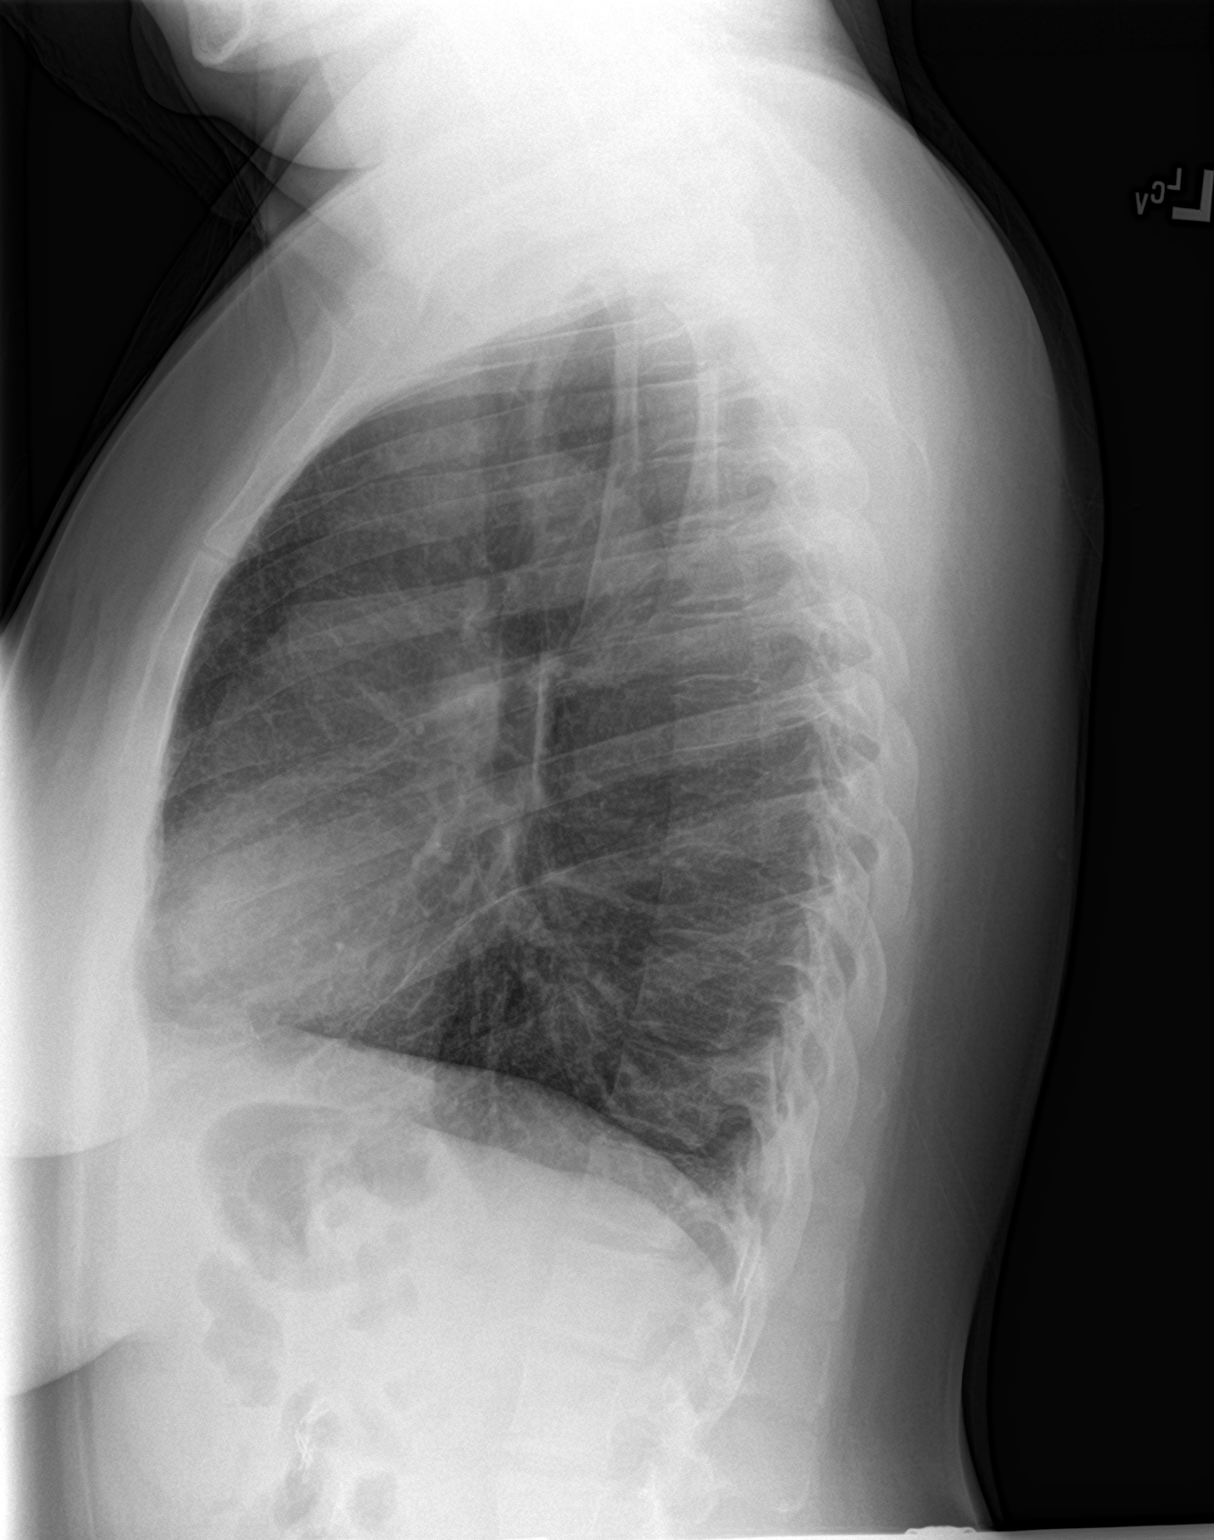

[2 of 2 positions shown; findings below may reference images not displayed]

FINDINGS: Coarse interstitial opacities in the lung bases are similar to
comparison exam. No consolidation, features of edema, pneumothorax,
or effusion. Pulmonary vascularity is normally distributed. The
cardiomediastinal contours are unremarkable. No acute osseous or
soft tissue abnormality. Stable deformity of the posterior right
fifth rib. Degenerative changes are present in the imaged spine and
shoulders.
IMPRESSION: No acute cardiopulmonary abnormality.

Coarse interstitial changes are similar to prior.

## 2021-01-16 DIAGNOSIS — J8489 Other specified interstitial pulmonary diseases: Secondary | ICD-10-CM | POA: Diagnosis not present

## 2021-01-16 DIAGNOSIS — M359 Systemic involvement of connective tissue, unspecified: Secondary | ICD-10-CM | POA: Diagnosis not present

## 2021-01-29 IMAGING — CR DG CHEST 2V
1 series · 2 of 2 positions shown · non-contrast
Comparison: 12/06/2018 and prior radiographs

CLINICAL DATA: Acute chest pain.

EXAM:
CHEST - 2 VIEW

[Series 1: dg chest 2 view · 0.14mm/px · 2 of 2 slices shown]
[im 1/2]
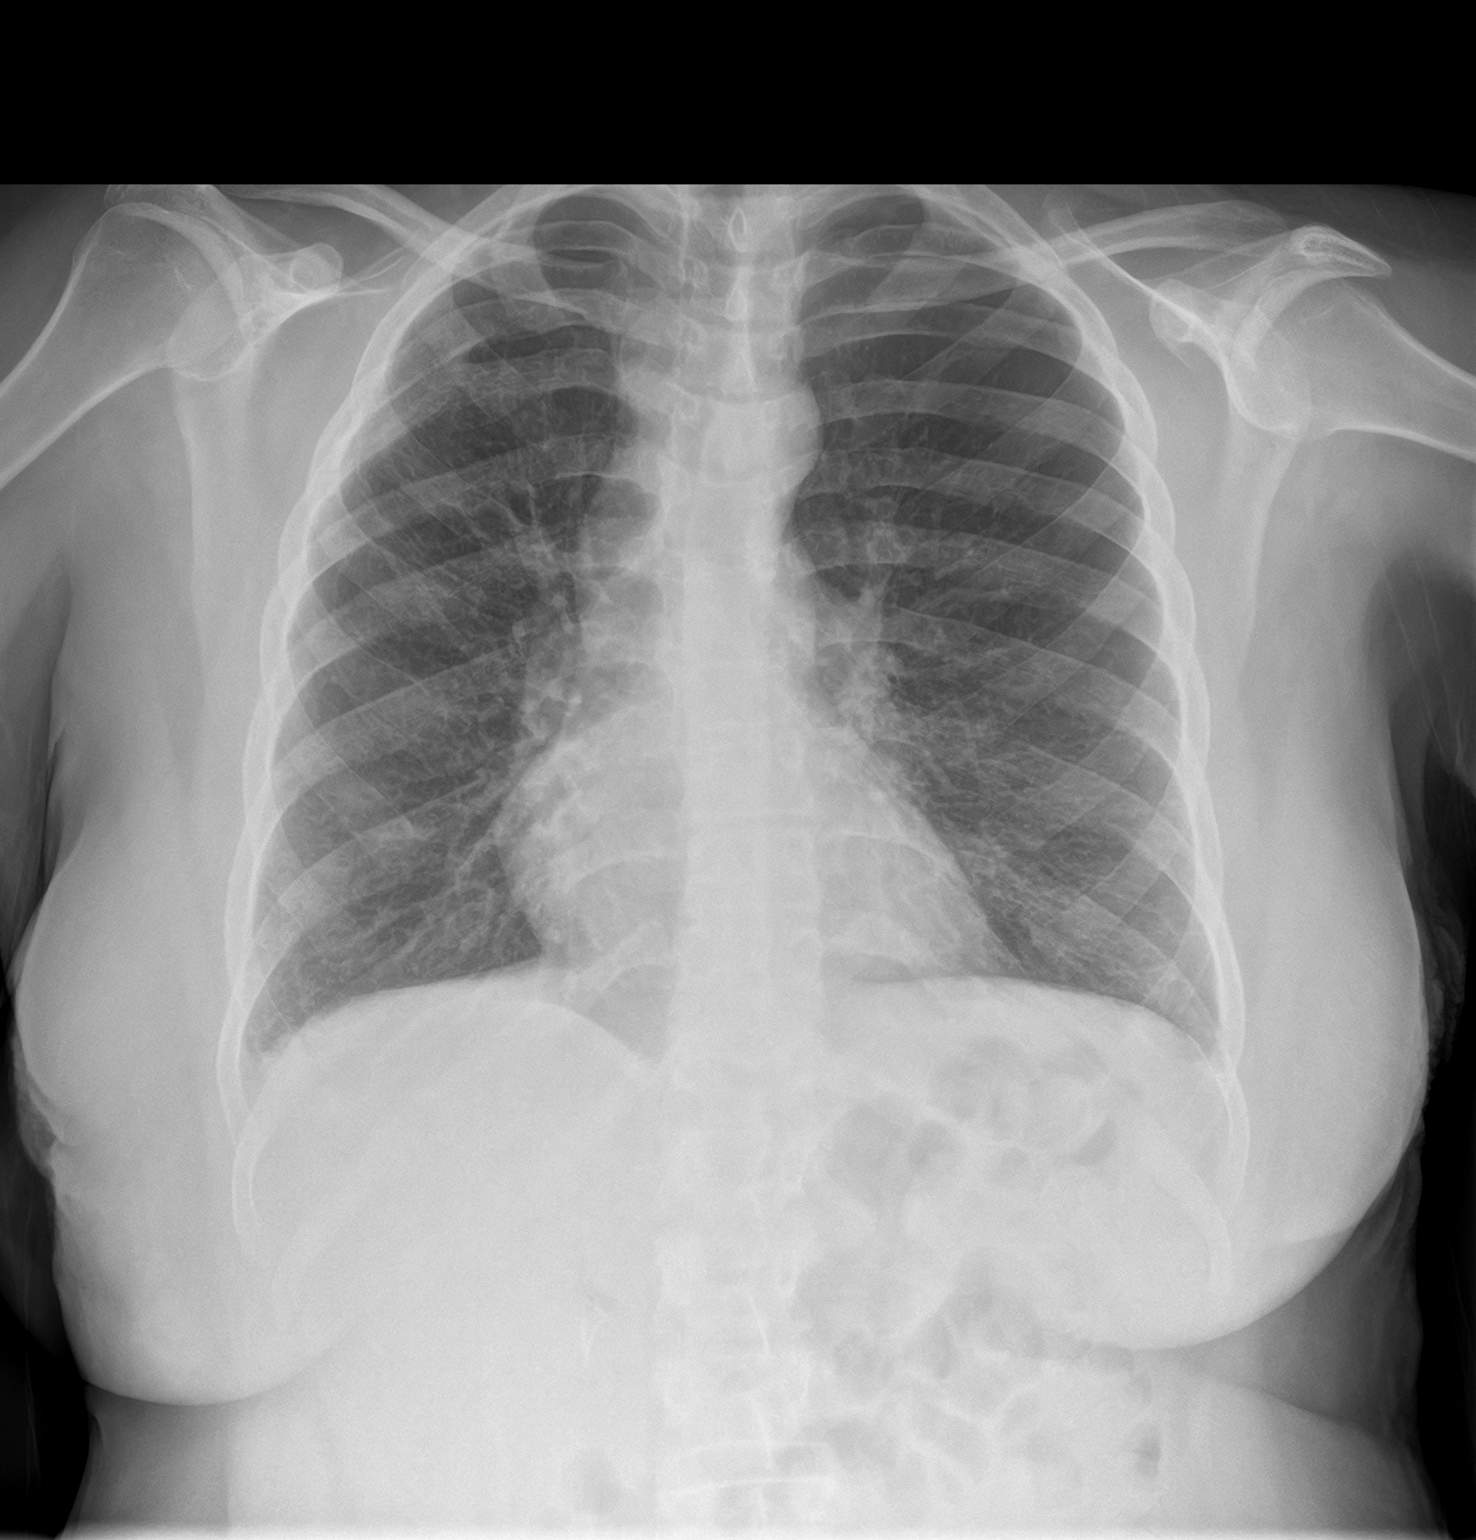
[im 2/2]
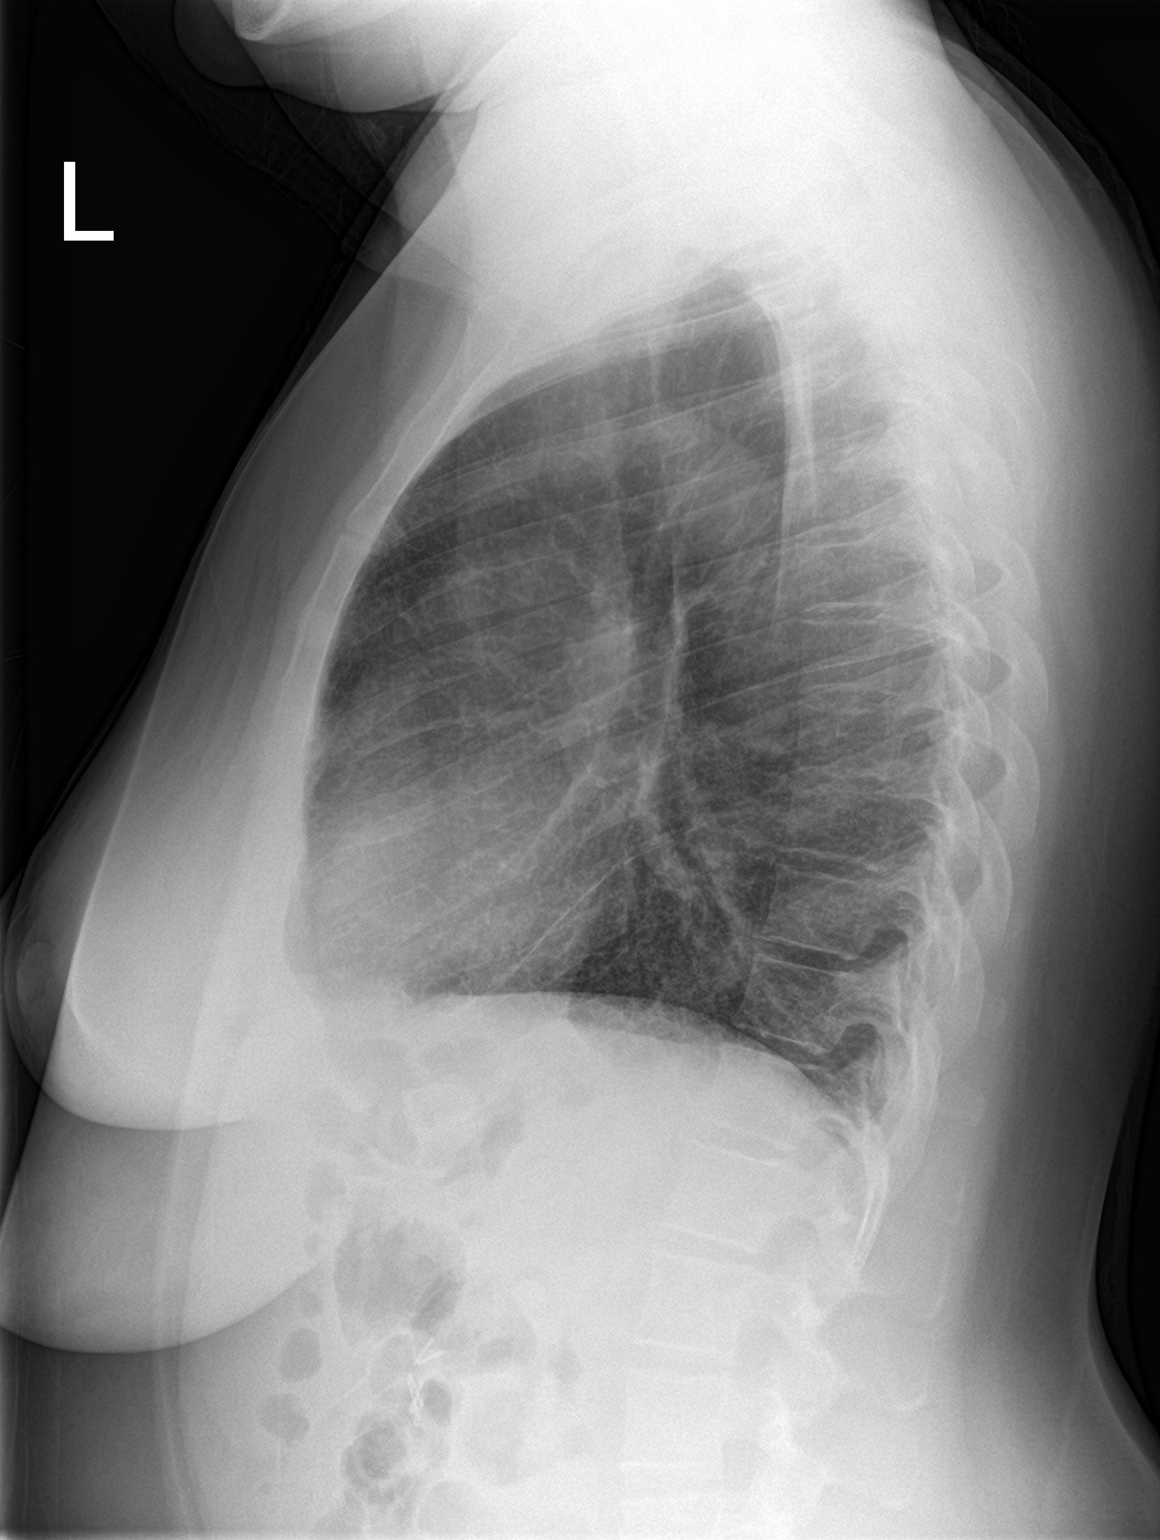

[2 of 2 positions shown; findings below may reference images not displayed]

FINDINGS: The cardiomediastinal silhouette is unremarkable.

Mild interstitial prominence again noted.

There is no evidence of focal airspace disease, pulmonary edema,
suspicious pulmonary nodule/mass, pleural effusion, or pneumothorax.

No acute bony abnormalities are identified. RIGHT rib
deformity/remote fracture again noted.
IMPRESSION: 1. No evidence of acute cardiopulmonary disease.
2. Stable mild interstitial prominence.

## 2021-01-31 ENCOUNTER — Telehealth: Payer: Self-pay

## 2021-01-31 NOTE — Telephone Encounter (Signed)
Transition Care Management Unsuccessful Follow-up Telephone Call  Date of discharge and from where:  01/30/2021-[facility]  Attempts:  1st Attempt  Reason for unsuccessful TCM follow-up call:  Unable to reach patient

## 2021-02-01 NOTE — Telephone Encounter (Signed)
Transition Care Management Unsuccessful Follow-up Telephone Call  Date of discharge and from where:  01/30/2021 from [facility]  Attempts:  2nd Attempt  Reason for unsuccessful TCM follow-up call:  Unable to leave message

## 2021-02-02 NOTE — Telephone Encounter (Signed)
Transition Care Management Unsuccessful Follow-up Telephone Call  Date of discharge and from where:  01/30/2021-[facility]  Attempts:  3rd Attempt  Reason for unsuccessful TCM follow-up call:  Unable to leave message

## 2021-05-20 IMAGING — CR DG ABDOMEN 2V
1 series · 2 of 2 positions shown · non-contrast
Comparison: None.

CLINICAL DATA: Abdominal pain and diarrhea

EXAM:
ABDOMEN - 2 VIEW

[Series 1: dg abd 2 views · 0.14mm/px · 2 of 2 slices shown]
[im 1/2]
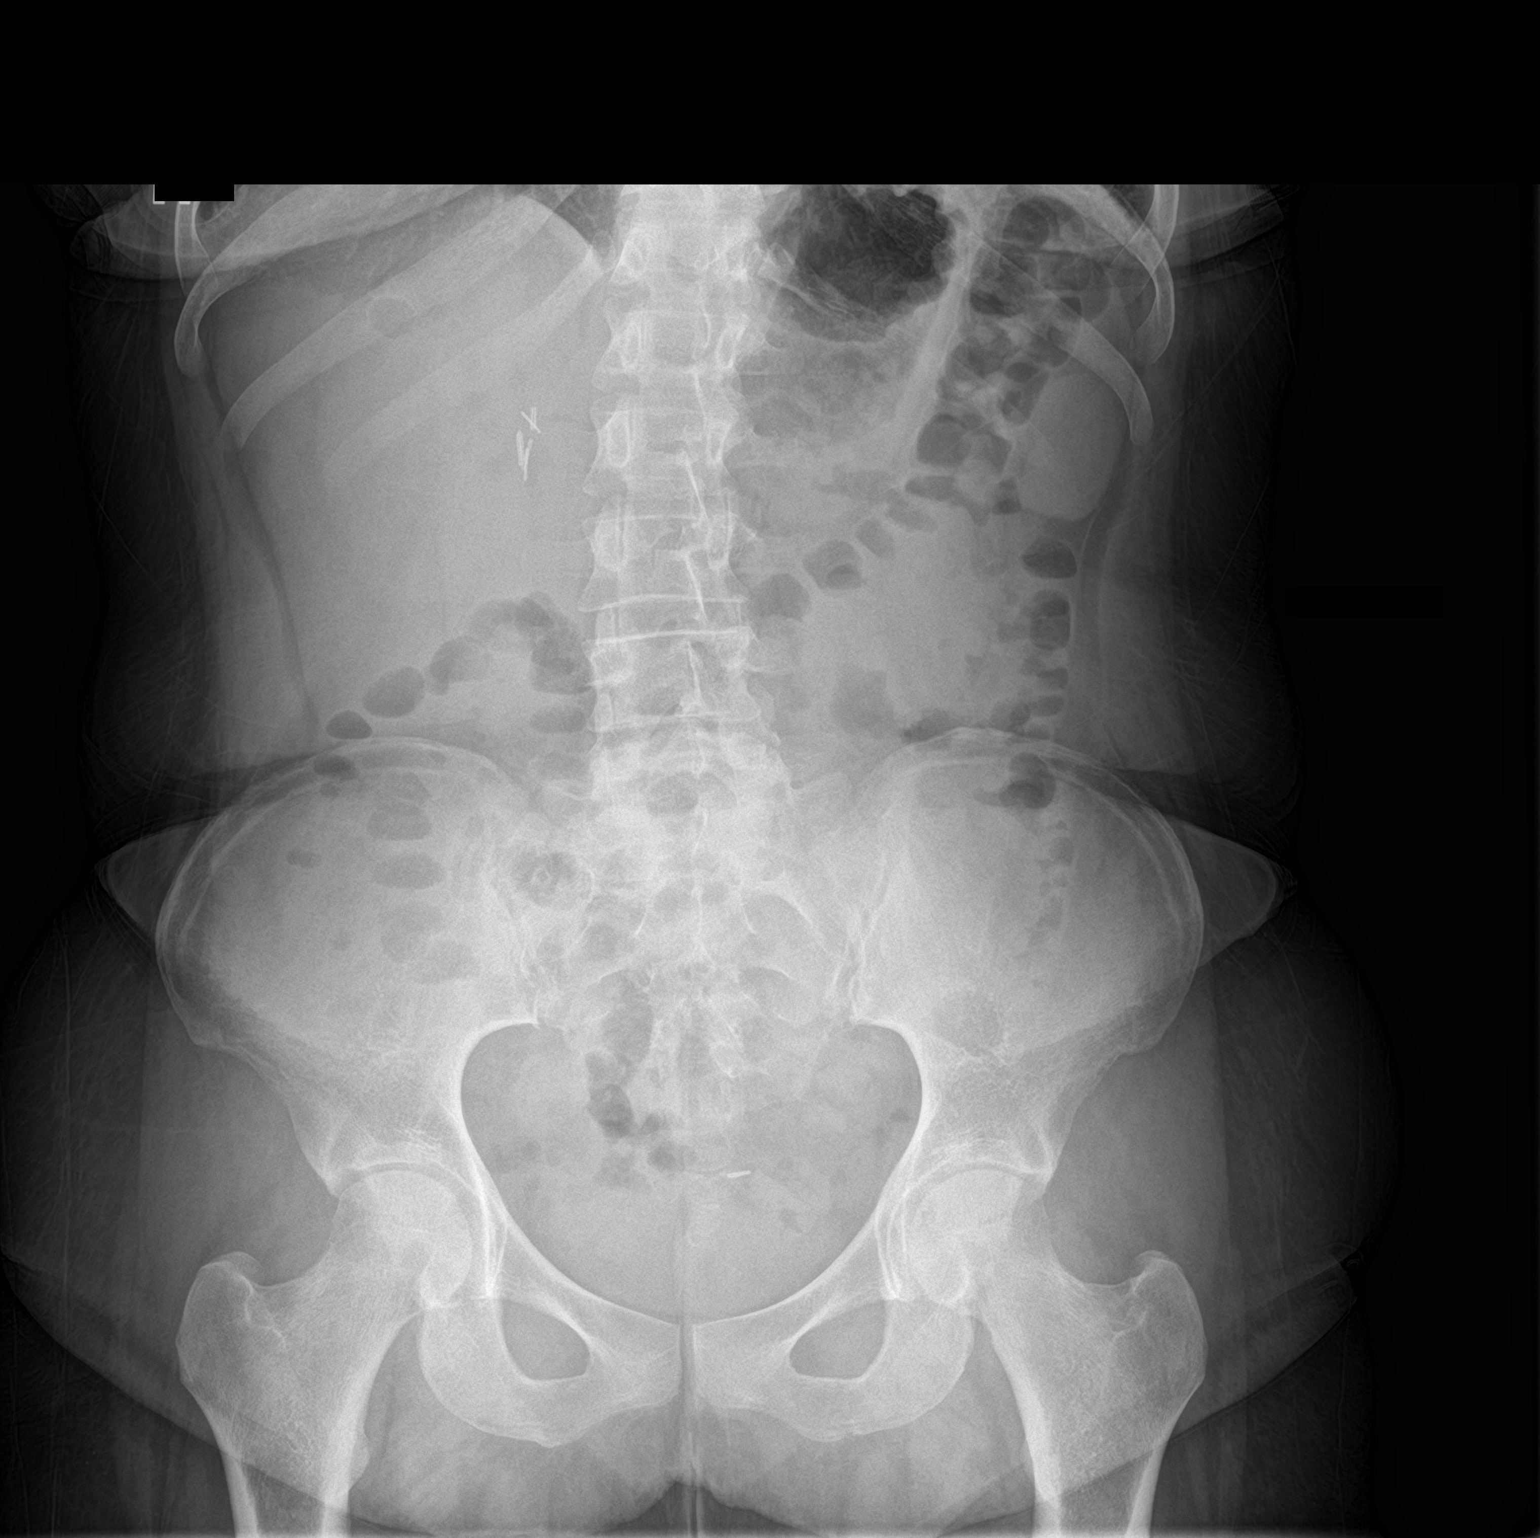
[im 2/2]
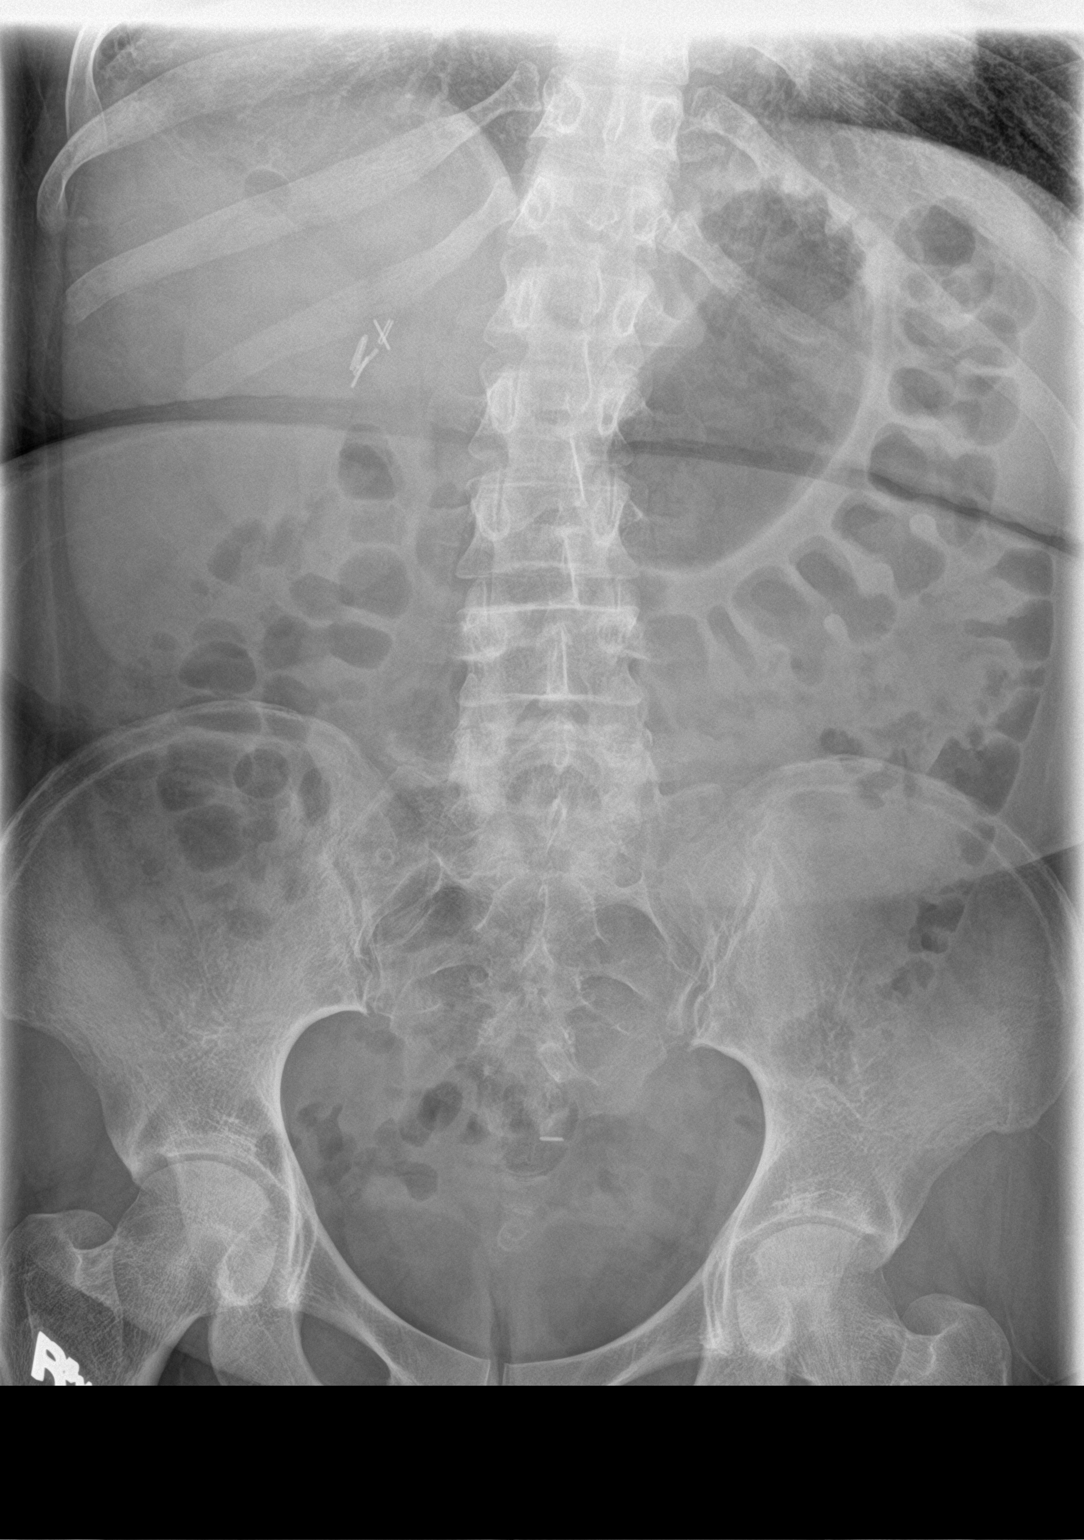

[2 of 2 positions shown; findings below may reference images not displayed]

FINDINGS: Scattered large and small bowel gas is noted. No abnormal mass or
abnormal calcifications are seen. Changes of prior cholecystectomy
are noted. No free air is noted. No acute bony abnormality is seen.
IMPRESSION: No acute abnormality noted.

## 2021-07-08 IMAGING — DX DG CHEST 1V
1 series · 1 of 1 positions shown · non-contrast
Comparison: Prior radiograph from 06/06/2019.

CLINICAL DATA: Initial evaluation for acute cough, shortness of
breath. History of asthma.

EXAM:
CHEST  1 VIEW

[chest ap]
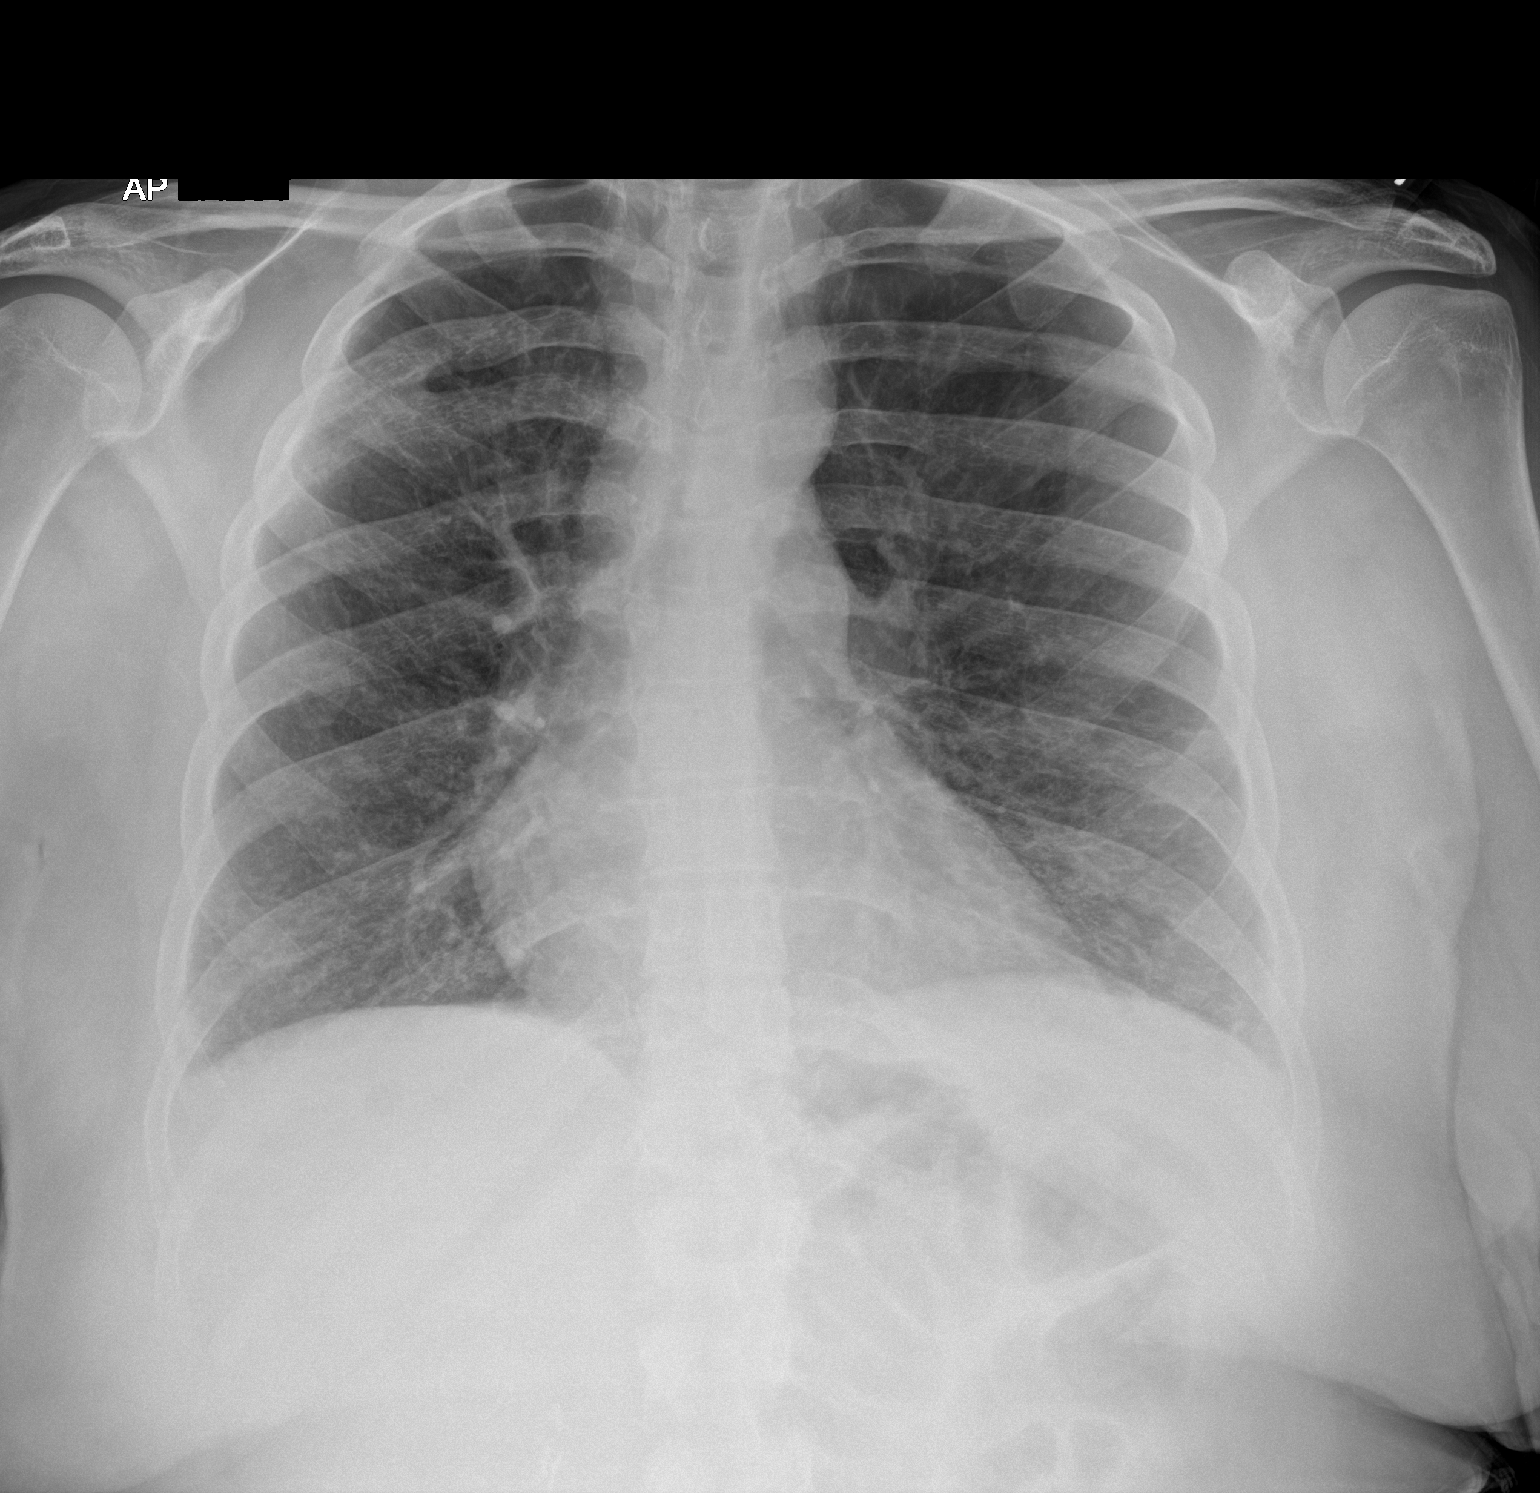

[1 of 1 positions shown; findings below may reference images not displayed]

FINDINGS: Cardiac and mediastinal silhouettes are within normal limits.

Lungs normally inflated. Scattered interstitial opacities seen
involving both lungs bilaterally, most pronounced at the lung bases,
and slightly greater on the left. No frank airspace consolidation.
No edema or effusion. No pneumothorax.

No acute osseous finding.
IMPRESSION: Scattered bilateral interstitial opacities, most pronounced at the
lung bases. Findings could reflect atypical pneumonitis and/or
reactive airways disease.

## 2022-03-27 ENCOUNTER — Telehealth: Payer: Self-pay | Admitting: Family Medicine

## 2022-03-27 NOTE — Transitions of Care (Post Inpatient/ED Visit) (Signed)
   03/27/2022  Name: Kassidy Dockendorf MRN: [id number] DOB: Oct 14, 1990  Today's TOC FU Call Status: Today's TOC FU Call Status:: Unsuccessul Call (1st Attempt) Unsuccessful Call (1st Attempt) Date: 03/27/22  Attempted to reach the patient regarding the most recent Inpatient/ED visit.  Follow Up Plan: Additional outreach attempts will be made to reach the patient to complete the Transitions of Care (Post Inpatient/ED visit) call.   Signature Rory Percy, [place] 03/27/2022 2:37 PM
